# Patient Record
Sex: Male | Born: 1955 | Race: Black or African American | Hispanic: No | Marital: Married | Smoking: Never smoker
Health system: Southern US, Community
[De-identification: ages and names within clinical notes are randomized; demographics above are authoritative.]

## PROBLEM LIST (undated history)

## (undated) DIAGNOSIS — I10 Essential (primary) hypertension: Secondary | ICD-10-CM

## (undated) DIAGNOSIS — I509 Heart failure, unspecified: Secondary | ICD-10-CM

## (undated) DIAGNOSIS — E119 Type 2 diabetes mellitus without complications: Secondary | ICD-10-CM

## (undated) DIAGNOSIS — N289 Disorder of kidney and ureter, unspecified: Secondary | ICD-10-CM

---

## 2013-06-15 ENCOUNTER — Emergency Department (HOSPITAL_BASED_OUTPATIENT_CLINIC_OR_DEPARTMENT_OTHER)
Admission: EM | Admit: 2013-06-15 | Discharge: 2013-06-15 | Disposition: A | Discharge status: Other Acute Inpatient Hospital | Payer: 59 | Attending: Emergency Medicine | Admitting: Emergency Medicine

## 2013-06-15 ENCOUNTER — Encounter (HOSPITAL_BASED_OUTPATIENT_CLINIC_OR_DEPARTMENT_OTHER): Payer: Self-pay | Admitting: Emergency Medicine

## 2013-06-15 ENCOUNTER — Emergency Department (HOSPITAL_BASED_OUTPATIENT_CLINIC_OR_DEPARTMENT_OTHER): Payer: 59

## 2013-06-15 DIAGNOSIS — Z7982 Long term (current) use of aspirin: Secondary | ICD-10-CM | POA: Insufficient documentation

## 2013-06-15 DIAGNOSIS — R61 Generalized hyperhidrosis: Secondary | ICD-10-CM | POA: Insufficient documentation

## 2013-06-15 DIAGNOSIS — R799 Abnormal finding of blood chemistry, unspecified: Secondary | ICD-10-CM | POA: Insufficient documentation

## 2013-06-15 DIAGNOSIS — R7989 Other specified abnormal findings of blood chemistry: Secondary | ICD-10-CM

## 2013-06-15 DIAGNOSIS — R748 Abnormal levels of other serum enzymes: Secondary | ICD-10-CM | POA: Insufficient documentation

## 2013-06-15 DIAGNOSIS — R05 Cough: Secondary | ICD-10-CM | POA: Insufficient documentation

## 2013-06-15 DIAGNOSIS — R059 Cough, unspecified: Secondary | ICD-10-CM | POA: Insufficient documentation

## 2013-06-15 DIAGNOSIS — Z79899 Other long term (current) drug therapy: Secondary | ICD-10-CM | POA: Insufficient documentation

## 2013-06-15 DIAGNOSIS — I161 Hypertensive emergency: Secondary | ICD-10-CM

## 2013-06-15 DIAGNOSIS — N179 Acute kidney failure, unspecified: Secondary | ICD-10-CM | POA: Insufficient documentation

## 2013-06-15 DIAGNOSIS — R0602 Shortness of breath: Secondary | ICD-10-CM | POA: Insufficient documentation

## 2013-06-15 DIAGNOSIS — I10 Essential (primary) hypertension: Secondary | ICD-10-CM | POA: Insufficient documentation

## 2013-06-15 HISTORY — DX: Essential (primary) hypertension: I10

## 2013-06-15 LAB — CBC WITH DIFFERENTIAL/PLATELET
Basophils Relative: 1 % (ref 0–1)
Eosinophils Absolute: 0.1 10*3/uL (ref 0.0–0.7)
HCT: 42.3 % (ref 39.0–52.0)
Hemoglobin: 13.9 g/dL (ref 13.0–17.0)
Lymphs Abs: 1.3 10*3/uL (ref 0.7–4.0)
MCH: 28.9 pg (ref 26.0–34.0)
MCHC: 32.9 g/dL (ref 30.0–36.0)
MCV: 87.9 fL (ref 78.0–100.0)
Monocytes Absolute: 0.7 10*3/uL (ref 0.1–1.0)
Monocytes Relative: 9 % (ref 3–12)
Neutro Abs: 5.8 10*3/uL (ref 1.7–7.7)
RBC: 4.81 MIL/uL (ref 4.22–5.81)
RDW: 15.4 % (ref 11.5–15.5)

## 2013-06-15 LAB — BASIC METABOLIC PANEL
BUN: 23 mg/dL (ref 6–23)
Chloride: 106 mEq/L (ref 96–112)
Creatinine, Ser: 2.5 mg/dL — ABNORMAL HIGH (ref 0.50–1.35)
GFR calc Af Amer: 31 mL/min — ABNORMAL LOW (ref >90)
Glucose, Bld: 231 mg/dL — ABNORMAL HIGH (ref 70–99)

## 2013-06-15 MED ORDER — LABETALOL HCL 5 MG/ML IV SOLN
20.0000 mg | Freq: Once | INTRAVENOUS | Status: AC
  Administered 2013-06-15: 20 mg via INTRAVENOUS

## 2013-06-15 MED ORDER — ASPIRIN 325 MG PO TABS
325.0000 mg | ORAL_TABLET | Freq: Once | ORAL | Status: AC
  Administered 2013-06-15: 325 mg via ORAL
  Filled 2013-06-15: qty 1

## 2013-06-15 MED ORDER — LABETALOL HCL 5 MG/ML IV SOLN
20.0000 mg | Freq: Once | INTRAVENOUS | Status: AC
  Administered 2013-06-15: 20 mg via INTRAVENOUS
  Filled 2013-06-15: qty 4

## 2013-06-15 MED ORDER — LABETALOL HCL 5 MG/ML IV SOLN
INTRAVENOUS | Status: AC
  Filled 2013-06-15: qty 4

## 2013-06-15 MED ORDER — HYDRALAZINE HCL 20 MG/ML IJ SOLN
10.0000 mg | Freq: Once | INTRAMUSCULAR | Status: AC
  Administered 2013-06-15: 10 mg via INTRAVENOUS
  Filled 2013-06-15: qty 1

## 2013-06-15 NOTE — ED Notes (Signed)
Critical lab called for troponin of 0.3, MD made aware.

## 2013-06-15 NOTE — ED Notes (Addendum)
Nosebleed this afternoon. No active bleeding at triage. BP noted. Pt states he does not take his BP medication.

## 2013-06-15 NOTE — ED Provider Notes (Signed)
CSN: [id number]     Arrival date & time 06/15/13  1805 History  This chart was scribed for Neta Ehlers, MD by Rolanda Lundborg, ED Scribe. This patient was seen in room MH09/MH09 and the patient's care was started at 6:53 PM.   Chief Complaint  Patient presents with  . Epistaxis   Patient is a 57 y.o. male presenting with nosebleeds. The history is provided by the patient. No language interpreter was used.  Epistaxis Progression:  Resolved Context: hypertension   Associated symptoms: cough   Associated symptoms: no congestion, no dizziness, no fever, no headaches and no sore throat   HPI Comments: Nickoli Puett is a 57 y.o. male with a h/o HTN who presents to the Emergency Department complaining of epistaxis of both nares that started this afternoon and lasted one hour, resolved in the ED, and started again ten minutes ago. He states it was coming out of his nose and going down his throat earlier today and is now going down his throat only. He has never had epistaxis before. Pt notes he has been off his blood pressure medication for two weeks. He states at baseline his BP fluctuates. He reports having mild SOB for the past week including at rest. He also reports diaphoresis and cough. 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 He denies headache, blurry vision, CP, nausea, emesis, changes in urine output.   Past Medical History  Diagnosis Date  . Hypertension    History reviewed. No pertinent past surgical history. No family history on file. History  Substance Use Topics  . Smoking status: Never Smoker   . Smokeless tobacco: Not on file  . Alcohol Use: Yes    Review of Systems  Constitutional: Positive for diaphoresis. Negative for fever, chills, activity change, appetite change and fatigue.  HENT: Positive for nosebleeds. Negative for congestion, drooling, facial swelling, rhinorrhea, sore throat, trouble swallowing and voice change.   Eyes: Negative for photophobia and  pain.  Respiratory: Positive for cough and shortness of breath. Negative for chest tightness and stridor.   Cardiovascular: Negative for chest pain and leg swelling.  Gastrointestinal: Negative for nausea, vomiting, abdominal pain, diarrhea, constipation and abdominal distention.  Endocrine: Negative for polydipsia and polyuria.  Genitourinary: Negative for dysuria, urgency, frequency, decreased urine volume and difficulty urinating.  Musculoskeletal: Negative for back pain, gait problem and neck pain.  Skin: Negative for color change, rash and wound.  Allergic/Immunologic: Negative for immunocompromised state.  Neurological: Negative for dizziness, facial asymmetry, speech difficulty, weakness, numbness and headaches.  Hematological: Does not bruise/bleed easily.  Psychiatric/Behavioral: Negative for confusion, decreased concentration and agitation.  All other systems reviewed and are negative.    Allergies  Review of patient's allergies indicates no known allergies.  Home Medications   Current Outpatient Rx  Name  Route  Sig  Dispense  Refill  . aspirin 81 MG tablet   Oral   Take 81 mg by mouth daily.         . benzonatate (TESSALON) 200 MG capsule   Oral   Take 200 mg by mouth 3 (three) times daily as needed for cough.         . cloNIDine (CATAPRES) 0.1 MG tablet   Oral   Take 0.1 mg by mouth 2 (two) times daily.         Marland Kitchen diltiazem (CARDIZEM CD) 240 MG 24 hr capsule   Oral   Take 240 mg by mouth daily.         Marland Kitchen  fish oil-omega-3 fatty acids 1000 MG capsule   Oral   Take 1 g by mouth daily.         . furosemide (LASIX) 20 MG tablet   Oral   Take 20 mg by mouth daily.         Marland Kitchen glimepiride (AMARYL) 4 MG tablet   Oral   Take 4 mg by mouth daily before breakfast.         . hydrALAZINE (APRESOLINE) 50 MG tablet   Oral   Take 50 mg by mouth 2 (two) times daily.         Marland Kitchen olmesartan-hydrochlorothiazide (BENICAR HCT) 40-25 MG per tablet   Oral    Take 1 tablet by mouth daily.         . pioglitazone-metformin (ACTOPLUS MET) 15-850 MG per tablet   Oral   Take 1 tablet by mouth daily.         . sitaGLIPtin (JANUVIA) 100 MG tablet   Oral   Take 100 mg by mouth daily.         . Vitamin D, Ergocalciferol, (DRISDOL) 50000 UNITS CAPS capsule   Oral   Take 50,000 Units by mouth every 7 (seven) days.          BP 259/157  Pulse 105  Temp(Src) 98.6 F (37 C) (Oral)  Resp 20  Wt 260 lb (117.935 kg)  SpO2 100% Physical Exam  Nursing note and vitals reviewed. Constitutional: He is oriented to person, place, and time. He appears well-developed and well-nourished. No distress.  HENT:  Head: Normocephalic and atraumatic.  Mouth/Throat: No oropharyngeal exudate.  Small amount of dark blood in posterior oropharynx w/o active bleeding.  Small amt dried blood L nare  Eyes: Pupils are equal, round, and reactive to light.  Neck: Normal range of motion. Neck supple.  Cardiovascular: Normal rate, regular rhythm and normal heart sounds.  Exam reveals no gallop and no friction rub.   No murmur heard. Pitting edema bilaterally.  Pulmonary/Chest: Effort normal and breath sounds normal. No respiratory distress. He has no wheezes. He has no rales.  Lungs are clear.  Abdominal: Soft. Bowel sounds are normal. He exhibits no distension and no mass. There is no tenderness. There is no rebound and no guarding.  Musculoskeletal: Normal range of motion. He exhibits no edema and no tenderness.  Neurological: He is alert and oriented to person, place, and time. He has normal strength. He displays no tremor. No cranial nerve deficit or sensory deficit. He exhibits normal muscle tone. He displays a negative Romberg sign. Coordination and gait normal. GCS eye subscore is 4. GCS verbal subscore is 5. GCS motor subscore is 6.  Cranial nerves are intact. Normal sensation. Normal coordination. Normal strength.  Skin: Skin is warm and dry.  Psychiatric: He  has a normal mood and affect.    ED Course  Procedures (including critical care time) Medications  hydrALAZINE (APRESOLINE) injection 10 mg (10 mg Intravenous Given 06/15/13 1938)  aspirin tablet 325 mg (325 mg Oral Given 06/15/13 2151)  labetalol (NORMODYNE,TRANDATE) injection 20 mg (20 mg Intravenous Given 06/15/13 2150)  labetalol (NORMODYNE,TRANDATE) 5 MG/ML injection (  Duplicate [id number])  labetalol (NORMODYNE,TRANDATE) injection 20 mg (20 mg Intravenous Given 06/15/13 2330)    DIAGNOSTIC STUDIES: Oxygen Saturation is 100% on RA, normal by my interpretation.    COORDINATION OF CARE: 7:26 PM- Discussed treatment plan with pt which includes CXR, CBC, BNP, BMP, troponin, hydralazine injection, and monitoring blood pressure. Pt  agrees to plan.    Labs Review Labs Reviewed  BASIC METABOLIC PANEL - Abnormal; Notable for the following:    Glucose, Bld 231 (*)    Creatinine, Ser 2.50 (*)    GFR calc non Af Amer 27 (*)    GFR calc Af Amer 31 (*)    All other components within normal limits  PRO B NATRIURETIC PEPTIDE - Abnormal; Notable for the following:    Pro B Natriuretic peptide (BNP) 11703.0 (*)    All other components within normal limits  TROPONIN I - Abnormal; Notable for the following:    Troponin I 0.30 (*)    All other components within normal limits  CBC WITH DIFFERENTIAL   Imaging Review Dg Chest 2 View  06/15/2013   CLINICAL DATA:  Epistaxis. Shortness of breath.  EXAM: CHEST  2 VIEW  COMPARISON:  None.  FINDINGS: Borderline enlarged cardiac silhouette. The lungs are mildly hyperexpanded with diffuse peribronchial thickening and mildly prominent interstitial markings. 1.2 cm nodular density overlying the right mid lung zone on the frontal view. Oval density overlying the left lower lung zone, laterally. This has an appearance suggesting callus formation associated with an old, healed 6 rib fracture. There is also a 2.1 cm nodular density overlying the heart on the  lateral view. Minimal bilateral pleural fluid. Enlarged right hilum and oval enlargement of the superior left hilum.  IMPRESSION: 1. Probable lung nodule or nodules, as described above. 2. Probable bilateral hilar adenopathy. 3. Minimal bilateral pleural fluid. 4. Changes of COPD and chronic bronchitis.  Recommendation: Chest CT with contrast to evaluate the probable lung nodules and hilar adenopathy.   Electronically Signed   By: Enrique Sack M.D.   On: 06/15/2013 20:16    EKG Interpretation   None      Date: 06/16/2013  Rate: 102  Rhythm: sinus tachycardia  QRS Axis: normal  Intervals: normal  ST/T Wave abnormalities: TW inversions inferior and lateral leads  Conduction Disutrbances:LA enlargement  Narrative Interpretation:   Old EKG Reviewed: none available    MDM   1. Hypertensive emergency   2. ARF (acute renal failure)   3. Elevated brain natriuretic peptide (BNP) level   4. Elevated troponin    Pt is a 57 y.o. male with Pmhx as above who presents with epistaxis that has resolved, found to have elevated BP of 259/157. Also reports SOB for about 1 week but denies h/a, neuro symptoms, CP, dec urination.  Epistaxis resolved, though able to visualize small amt blood in posterior oropharynx.  W/U here shows EKG w/ TWI, elevated BNP, Trop, Cr concerning for end organ ischemia.  Pt given dose of hydralazine, labetalol x2 with improvement of BP.  Pt and family agree to admission to [facility] with cardiology (Dr. Maida Sale), for hypertensive emergency.      I personally performed the services described in this documentation, which was scribed in my presence. The recorded information has been reviewed and is accurate.     Neta Ehlers, MD 06/16/13 [phone]

## 2015-01-13 ENCOUNTER — Emergency Department (HOSPITAL_BASED_OUTPATIENT_CLINIC_OR_DEPARTMENT_OTHER)
Admission: EM | Admit: 2015-01-13 | Discharge: 2015-01-13 | Disposition: A | Discharge status: Home / Self Care | Payer: 59 | Attending: Emergency Medicine | Admitting: Emergency Medicine

## 2015-01-13 ENCOUNTER — Encounter (HOSPITAL_BASED_OUTPATIENT_CLINIC_OR_DEPARTMENT_OTHER): Payer: Self-pay | Admitting: *Deleted

## 2015-01-13 ENCOUNTER — Emergency Department (HOSPITAL_BASED_OUTPATIENT_CLINIC_OR_DEPARTMENT_OTHER): Payer: 59

## 2015-01-13 DIAGNOSIS — M25551 Pain in right hip: Secondary | ICD-10-CM | POA: Diagnosis present

## 2015-01-13 DIAGNOSIS — Z7982 Long term (current) use of aspirin: Secondary | ICD-10-CM | POA: Insufficient documentation

## 2015-01-13 DIAGNOSIS — I10 Essential (primary) hypertension: Secondary | ICD-10-CM | POA: Diagnosis not present

## 2015-01-13 DIAGNOSIS — E119 Type 2 diabetes mellitus without complications: Secondary | ICD-10-CM | POA: Diagnosis not present

## 2015-01-13 DIAGNOSIS — Z79899 Other long term (current) drug therapy: Secondary | ICD-10-CM | POA: Diagnosis not present

## 2015-01-13 DIAGNOSIS — M1611 Unilateral primary osteoarthritis, right hip: Secondary | ICD-10-CM

## 2015-01-13 HISTORY — DX: Disorder of kidney and ureter, unspecified: N28.9

## 2015-01-13 HISTORY — DX: Heart failure, unspecified: I50.9

## 2015-01-13 HISTORY — DX: Type 2 diabetes mellitus without complications: E11.9

## 2015-01-13 MED ORDER — KETOROLAC TROMETHAMINE 60 MG/2ML IM SOLN
30.0000 mg | Freq: Once | INTRAMUSCULAR | Status: AC
  Administered 2015-01-13: 30 mg via INTRAMUSCULAR
  Filled 2015-01-13: qty 2

## 2015-01-13 MED ORDER — LABETALOL HCL 200 MG PO TABS
200.0000 mg | ORAL_TABLET | Freq: Once | ORAL | Status: DC
  Filled 2015-01-13: qty 1

## 2015-01-13 MED ORDER — HYDROCHLOROTHIAZIDE 25 MG PO TABS
25.0000 mg | ORAL_TABLET | Freq: Once | ORAL | Status: AC
  Administered 2015-01-13: 25 mg via ORAL
  Filled 2015-01-13: qty 1

## 2015-01-13 NOTE — Discharge Instructions (Signed)
Return to the emergency room with worsening of symptoms, new symptoms or with symptoms that are concerning, especially chest pain, shortness of breath, headache, visual changes, blood in urine or fevers, chills, nausea, vomiting, redness, swelling, unable to move right hip. RICE: Rest, Ice (three cycles of 20 mins on, 33mins off at least twice a day), compression/brace, elevation. Heating pad works well for back pain. Follow up with PCP/orthopedist if symptoms worsen or are persistent. Please call your doctor for a followup appointment within 24-48 hours. When you talk to your doctor please let them know that you were seen in the emergency department  and formulate a treatment plan to fully care for your new and ongoing problems.  Read below information and follow recommendations. Hip Exercises RANGE OF MOTION (ROM) AND STRETCHING EXERCISES  These exercises may help you when beginning to rehabilitate your injury. Doing them too aggressively can worsen your condition. Complete them slowly and gently. Your symptoms may resolve with or without further involvement from your physician, physical therapist or athletic trainer. While completing these exercises, remember:   Restoring tissue flexibility helps normal motion to return to the joints. This allows healthier, less painful movement and activity.  An effective stretch should be held for at least 30 seconds.  A stretch should never be painful. You should only feel a gentle lengthening or release in the stretched tissue. If these stretches worsen your symptoms even when done gently, consult your physician, physical therapist or athletic trainer. STRETCH - Hamstrings, Supine   Lie on your back. Loop a belt or towel over the ball of your right / left foot.  Straighten your right / left knee and slowly pull on the belt to raise your leg. Do not allow the right / left knee to bend. Keep your opposite leg flat on the floor.  Raise the leg until you feel  a gentle stretch behind your right / left knee or thigh. Hold this position for __________ seconds. Repeat __________ times. Complete this stretch __________ times per day.  STRETCH - Hip Rotators   Lie on your back on a firm surface. Grasp your right / left knee with your right / left hand and your ankle with your opposite hand.  Keeping your hips and shoulders firmly planted, gently pull your right / left knee and rotate your lower leg toward your opposite shoulder until you feel a stretch in your buttocks.  Hold this stretch for __________ seconds. Repeat this stretch __________ times. Complete this stretch __________ times per day. STRETCH - Hamstrings/Adductors, V-Sit   Sit on the floor with your legs extended in a large "V," keeping your knees straight.  With your head and chest upright, bend at your waist reaching for your right foot to stretch your left adductors.  You should feel a stretch in your left inner thigh. Hold for __________ seconds.  Return to the upright position to relax your leg muscles.  Continuing to keep your chest upright, bend straight forward at your waist to stretch your hamstrings.  You should feel a stretch behind both of your thighs and/or knees. Hold for __________ seconds.  Return to the upright position to relax your leg muscles.  Repeat steps 2 through 4 for opposite leg. Repeat __________ times. Complete this exercise __________ times per day.  STRETCHING - Hip Flexors, Lunge  Half kneel with your right / left knee on the floor and your opposite knee bent and directly over your ankle.  Keep good posture with your  head over your shoulders. Tighten your buttocks to point your tailbone downward; this will prevent your back from arching too much.  You should feel a gentle stretch in the front of your thigh and/or hip. If you do not feel any resistance, slightly slide your opposite foot forward and then slowly lunge forward so your knee once again  lines up over your ankle. Be sure your tailbone remains pointed downward.  Hold this stretch for __________ seconds. Repeat __________ times. Complete this stretch __________ times per day. STRENGTHENING EXERCISES These exercises may help you when beginning to rehabilitate your injury. They may resolve your symptoms with or without further involvement from your physician, physical therapist or athletic trainer. While completing these exercises, remember:   Muscles can gain both the endurance and the strength needed for everyday activities through controlled exercises.  Complete these exercises as instructed by your physician, physical therapist or athletic trainer. Progress the resistance and repetitions only as guided.  You may experience muscle soreness or fatigue, but the pain or discomfort you are trying to eliminate should never worsen during these exercises. If this pain does worsen, stop and make certain you are following the directions exactly. If the pain is still present after adjustments, discontinue the exercise until you can discuss the trouble with your clinician. STRENGTH - Hip Extensors, Bridge   Lie on your back on a firm surface. Bend your knees and place your feet flat on the floor.  Tighten your buttocks muscles and lift your bottom off the floor until your trunk is level with your thighs. You should feel the muscles in your buttocks and back of your thighs working. If you do not feel these muscles, slide your feet 1-2 inches further away from your buttocks.  Hold this position for __________ seconds.  Slowly lower your hips to the starting position and allow your buttock muscles relax completely before beginning the next repetition.  If this exercise is too easy, you may cross your arms over your chest. Repeat __________ times. Complete this exercise __________ times per day.  STRENGTH - Hip Abductors, Straight Leg Raises  Be aware of your form throughout the entire  exercise so that you exercise the correct muscles. Sloppy form means that you are not strengthening the correct muscles.  Lie on your side so that your head, shoulders, knee and hip line up. You may bend your lower knee to help maintain your balance. Your right / left leg should be on top.  Roll your hips slightly forward, so that your hips are stacked directly over each other and your right / left knee is facing forward.  Lift your top leg up 4-6 inches, leading with your heel. Be sure that your foot does not drift forward or that your knee does not roll toward the ceiling.  Hold this position for __________ seconds. You should feel the muscles in your outer hip lifting (you may not notice this until your leg begins to tire).  Slowly lower your leg to the starting position. Allow the muscles to fully relax before beginning the next repetition. Repeat __________ times. Complete this exercise __________ times per day.  STRENGTH - Hip Adductors, Straight Leg Raises   Lie on your side so that your head, shoulders, knee and hip line up. You may place your upper foot in front to help maintain your balance. Your right / left leg should be on the bottom.  Roll your hips slightly forward, so that your hips are stacked directly  over each other and your right / left knee is facing forward.  Tense the muscles in your inner thigh and lift your bottom leg 4-6 inches. Hold this position for __________ seconds.  Slowly lower your leg to the starting position. Allow the muscles to fully relax before beginning the next repetition. Repeat __________ times. Complete this exercise __________ times per day.  STRENGTH - Quadriceps, Straight Leg Raises  Quality counts! Watch for signs that the quadriceps muscle is working to insure you are strengthening the correct muscles and not "cheating" by substituting with healthier muscles.  Lay on your back with your right / left leg extended and your opposite knee  bent.  Tense the muscles in the front of your right / left thigh. You should see either your knee cap slide up or increased dimpling just above the knee. Your thigh may even quiver.  Tighten these muscles even more and raise your leg 4 to 6 inches off the floor. Hold for right / left seconds.  Keeping these muscles tense, lower your leg.  Relax the muscles slowly and completely in between each repetition. Repeat __________ times. Complete this exercise __________ times per day.  STRENGTH - Hip Abductors, Standing  Tie one end of a rubber exercise band/tubing to a secure surface (table, pole) and tie a loop at the other end.  Place the loop around your right / left ankle. Keeping your ankle with the band directly opposite of the secured end, step away until there is tension in the tube/band.  Hold onto a chair as needed for balance.  Keeping your back upright, your shoulders over your hips, and your toes pointing forward, lift your right / left leg out to your side. Be sure to lift your leg with your hip muscles. Do not "throw" your leg or tip your body to lift your leg.  Slowly and with control, return to the starting position. Repeat exercise __________ times. Complete this exercise __________ times per day.  STRENGTH - Quadriceps, Squats  Stand in a door frame so that your feet and knees are in line with the frame.  Use your hands for balance, not support, on the frame.  Slowly lower your weight, bending at the hips and knees. Keep your lower legs upright so that they are parallel with the door frame. Squat only within the range that does not increase your knee pain. Never let your hips drop below your knees.  Slowly return upright, pushing with your legs, not pulling with your hands. Document Released: 09/11/2005 Document Revised: 11/16/2011 Document Reviewed: 12/06/2008 [facility] Patient Information 2015 [place], [place]. This information is not intended to replace advice given to  you by your health care provider. Make sure you discuss any questions you have with your health care provider. Hypertension Hypertension, commonly called high blood pressure, is when the force of blood pumping through your arteries is too strong. Your arteries are the blood vessels that carry blood from your heart throughout your body. A blood pressure reading consists of a higher number over a lower number, such as 110/72. The higher number (systolic) is the pressure inside your arteries when your heart pumps. The lower number (diastolic) is the pressure inside your arteries when your heart relaxes. Ideally you want your blood pressure below 120/80. Hypertension forces your heart to work harder to pump blood. Your arteries may become narrow or stiff. Having hypertension puts you at risk for heart disease, stroke, and other problems.  RISK FACTORS Some risk factors  for high blood pressure are controllable. Others are not.  Risk factors you cannot control include:   Race. You may be at higher risk if you are African American.  Age. Risk increases with age.  Gender. Men are at higher risk than women before age 44 years. After age 43, women are at higher risk than men. Risk factors you can control include:  Not getting enough exercise or physical activity.  Being overweight.  Getting too much fat, sugar, calories, or salt in your diet.  Drinking too much alcohol. SIGNS AND SYMPTOMS Hypertension does not usually cause signs or symptoms. Extremely high blood pressure (hypertensive crisis) may cause headache, anxiety, shortness of breath, and nosebleed. DIAGNOSIS  To check if you have hypertension, your health care provider will measure your blood pressure while you are seated, with your arm held at the level of your heart. It should be measured at least twice using the same arm. Certain conditions can cause a difference in blood pressure between your right and left arms. A blood pressure reading  that is higher than normal on one occasion does not mean that you need treatment. If one blood pressure reading is high, ask your health care provider about having it checked again. TREATMENT  Treating high blood pressure includes making lifestyle changes and possibly taking medicine. Living a healthy lifestyle can help lower high blood pressure. You may need to change some of your habits. Lifestyle changes may include:  Following the DASH diet. This diet is high in fruits, vegetables, and whole grains. It is low in salt, red meat, and added sugars.  Getting at least 2 hours of brisk physical activity every week.  Losing weight if necessary.  Not smoking.  Limiting alcoholic beverages.  Learning ways to reduce stress. If lifestyle changes are not enough to get your blood pressure under control, your health care provider may prescribe medicine. You may need to take more than one. Work closely with your health care provider to understand the risks and benefits. HOME CARE INSTRUCTIONS  Have your blood pressure rechecked as directed by your health care provider.   Take medicines only as directed by your health care provider. Follow the directions carefully. Blood pressure medicines must be taken as prescribed. The medicine does not work as well when you skip doses. Skipping doses also puts you at risk for problems.   Do not smoke.   Monitor your blood pressure at home as directed by your health care provider. SEEK MEDICAL CARE IF:   You think you are having a reaction to medicines taken.  You have recurrent headaches or feel dizzy.  You have swelling in your ankles.  You have trouble with your vision. SEEK IMMEDIATE MEDICAL CARE IF:  You develop a severe headache or confusion.  You have unusual weakness, numbness, or feel faint.  You have severe chest or abdominal pain.  You vomit repeatedly.  You have trouble breathing. MAKE SURE YOU:   Understand these  instructions.  Will watch your condition.  Will get help right away if you are not doing well or get worse. Document Released: 08/24/2005 Document Revised: 01/08/2014 Document Reviewed: 06/16/2013 [facility] Patient Information 2015 [place], [place]. This information is not intended to replace advice given to you by your health care provider. Make sure you discuss any questions you have with your health care provider.

## 2015-01-13 NOTE — ED Provider Notes (Signed)
CSN: [id number]     Arrival date & time 01/13/15  1428 History   First MD Initiated Contact with Patient 01/13/15 1449     Chief Complaint  Patient presents with  . Hip Pain     (Consider location/radiation/quality/duration/timing/severity/associated sxs/prior Treatment) HPI  Kostas Shadd is a 59 y.o. male with PMH of attention, diabetes presenting with 1 week of intermittent aching right hip pain worse with ambulation. Patient does not begin anything for his symptoms. Patient denies any fevers, chills, nausea, vomiting, numbness, tingling, redness or swelling. No injury or fall. No chest pain, shortness of breath, headache, visual changes, hematuria. No abdominal pain.   Past Medical History  Diagnosis Date  . Hypertension   . Diabetes mellitus without complication    History reviewed. No pertinent past surgical history. No family history on file. History  Substance Use Topics  . Smoking status: Never Smoker   . Smokeless tobacco: Never Used  . Alcohol Use: Yes     Comment: occasional    Review of Systems See above   Allergies  Review of patient's allergies indicates no known allergies.  Home Medications   Prior to Admission medications   Medication Sig Start Date End Date Taking? Authorizing Provider  aspirin 81 MG tablet Take 81 mg by mouth daily.   Yes Historical Provider, MD  atorvastatin (LIPITOR) 20 MG tablet Take 20 mg by mouth daily.   Yes Historical Provider, MD  doxazosin (CARDURA) 4 MG tablet Take 4 mg by mouth daily.   Yes Historical Provider, MD  fish oil-omega-3 fatty acids 1000 MG capsule Take 1 g by mouth daily.   Yes Historical Provider, MD  glimepiride (AMARYL) 4 MG tablet Take 4 mg by mouth daily before breakfast.   Yes Historical Provider, MD  hydrALAZINE (APRESOLINE) 50 MG tablet Take 100 mg by mouth 3 (three) times daily.    Yes Historical Provider, MD  isosorbide dinitrate (ISORDIL) 20 MG tablet Take 20 mg by mouth daily.   Yes Historical Provider,  MD  labetalol (NORMODYNE) 200 MG tablet Take 200 mg by mouth daily.   Yes Historical Provider, MD  losartan-hydrochlorothiazide (HYZAAR) 100-25 MG per tablet Take 1 tablet by mouth daily.   Yes Historical Provider, MD  sitaGLIPtin (JANUVIA) 100 MG tablet Take 100 mg by mouth daily.   Yes Historical Provider, MD  tamsulosin (FLOMAX) 0.4 MG CAPS capsule Take 0.4 mg by mouth.   Yes Historical Provider, MD  torsemide (DEMADEX) 20 MG tablet Take 20 mg by mouth daily.   Yes Historical Provider, MD  Vitamin D, Ergocalciferol, (DRISDOL) 50000 UNITS CAPS capsule Take 50,000 Units by mouth every 7 (seven) days.   Yes Historical Provider, MD  benzonatate (TESSALON) 200 MG capsule Take 200 mg by mouth 3 (three) times daily as needed for cough.    Historical Provider, MD  cloNIDine (CATAPRES) 0.1 MG tablet Take 0.1 mg by mouth 2 (two) times daily.    Historical Provider, MD  diltiazem (CARDIZEM CD) 240 MG 24 hr capsule Take 240 mg by mouth daily.    Historical Provider, MD  furosemide (LASIX) 20 MG tablet Take 20 mg by mouth daily.    Historical Provider, MD  olmesartan-hydrochlorothiazide (BENICAR HCT) 40-25 MG per tablet Take 1 tablet by mouth daily.    Historical Provider, MD  pioglitazone-metformin (ACTOPLUS MET) 15-850 MG per tablet Take 1 tablet by mouth daily.    Historical Provider, MD   BP 193/98 mmHg  Pulse 61  Temp(Src) 98.3 F (36.8  C) (Oral)  Resp 16  Ht 6' (1.829 m)  Wt 268 lb (121.564 kg)  BMI 36.34 kg/m2  SpO2 97% Physical Exam  Constitutional: He appears well-developed and well-nourished. No distress.  HENT:  Head: Normocephalic and atraumatic.  Eyes: Conjunctivae are normal. Right eye exhibits no discharge. Left eye exhibits no discharge.  Cardiovascular: Normal rate and regular rhythm.   2+ DP and PT pulses equal bilaterally.  Pulmonary/Chest: Effort normal and breath sounds normal. No respiratory distress.  Abdominal: Soft. There is no tenderness.  Musculoskeletal:  Right  lateral hip tenderness to palpation at IT band. FROM without pain. No skin changes strength and sensation intact. Pt ambulatory  Neurological: He is alert. Coordination normal.  Skin: He is not diaphoretic.  Psychiatric: He has a normal mood and affect. His behavior is normal.  Nursing note and vitals reviewed.   ED Course  Procedures (including critical care time) Labs Review Labs Reviewed - No data to display  Imaging Review Dg Hip Unilat With Pelvis 2-3 Views Right  01/13/2015   CLINICAL DATA:  Right hip pain for 1 week, no known injury, initial encounter  EXAM: RIGHT HIP (WITH PELVIS) 2-3 VIEWS  COMPARISON:  None.  FINDINGS: No acute fracture or dislocation is noted. Mild degenerative changes of the hip joint are seen. No soft tissue changes are noted.  IMPRESSION: No acute abnormality seen.   Electronically Signed   By: Inez Catalina M.D.   On: 01/13/2015 16:02     EKG Interpretation None      MDM   Final diagnoses:  Hip pain, right  Essential hypertension  Osteoarthritis of right hip, unspecified osteoarthritis type   Patient X-Ray negative for obvious fracture or dislocation. Mild degenerative changes. Pain managed in ED. Pt without erythema, edema or warmth to joint. Neurovascularly intact. I doubt septic arthritis. Pt advised to follow up with PCP/orthopedics if symptoms persist. Discussed RICE, and conservative therapy recommended and discussed.   Patient noted to be hypertensive in the emergency department.  No signs of hypertensive urgency. Pt with reproducible right hip pain and no back or abdominal pain. Exam and presentation not consistent with AAA. Discussed with patient the need for close follow-up and management by their primary care physician.   Discussed return precautions with patient. Discussed all results and patient verbalizes understanding and agrees with plan.    Al Corpus, PA-C 01/13/15 [place], MD 01/13/15 1859

## 2015-01-13 NOTE — ED Notes (Signed)
Right hip pain x 1 week- denies fall or other injury

## 2017-09-14 ENCOUNTER — Other Ambulatory Visit: Payer: Self-pay

## 2017-09-14 ENCOUNTER — Emergency Department (HOSPITAL_BASED_OUTPATIENT_CLINIC_OR_DEPARTMENT_OTHER)
Admission: EM | Admit: 2017-09-14 | Discharge: 2017-09-14 | DRG: 291 | Disposition: A | Discharge status: Home / Self Care | Payer: 59 | Attending: Internal Medicine | Admitting: Internal Medicine

## 2017-09-14 ENCOUNTER — Encounter (HOSPITAL_BASED_OUTPATIENT_CLINIC_OR_DEPARTMENT_OTHER): Payer: Self-pay | Admitting: Emergency Medicine

## 2017-09-14 ENCOUNTER — Emergency Department (HOSPITAL_BASED_OUTPATIENT_CLINIC_OR_DEPARTMENT_OTHER): Payer: 59

## 2017-09-14 DIAGNOSIS — N183 Chronic kidney disease, stage 3 (moderate): Secondary | ICD-10-CM | POA: Diagnosis not present

## 2017-09-14 DIAGNOSIS — Z7982 Long term (current) use of aspirin: Secondary | ICD-10-CM

## 2017-09-14 DIAGNOSIS — Z7984 Long term (current) use of oral hypoglycemic drugs: Secondary | ICD-10-CM | POA: Diagnosis not present

## 2017-09-14 DIAGNOSIS — N179 Acute kidney failure, unspecified: Secondary | ICD-10-CM | POA: Diagnosis present

## 2017-09-14 DIAGNOSIS — Z79899 Other long term (current) drug therapy: Secondary | ICD-10-CM

## 2017-09-14 DIAGNOSIS — R0602 Shortness of breath: Secondary | ICD-10-CM | POA: Diagnosis present

## 2017-09-14 DIAGNOSIS — E1122 Type 2 diabetes mellitus with diabetic chronic kidney disease: Secondary | ICD-10-CM | POA: Diagnosis present

## 2017-09-14 DIAGNOSIS — Z9119 Patient's noncompliance with other medical treatment and regimen: Secondary | ICD-10-CM

## 2017-09-14 DIAGNOSIS — N189 Chronic kidney disease, unspecified: Secondary | ICD-10-CM

## 2017-09-14 DIAGNOSIS — G4733 Obstructive sleep apnea (adult) (pediatric): Secondary | ICD-10-CM | POA: Diagnosis present

## 2017-09-14 DIAGNOSIS — I130 Hypertensive heart and chronic kidney disease with heart failure and stage 1 through stage 4 chronic kidney disease, or unspecified chronic kidney disease: Secondary | ICD-10-CM | POA: Diagnosis not present

## 2017-09-14 DIAGNOSIS — I5043 Acute on chronic combined systolic (congestive) and diastolic (congestive) heart failure: Secondary | ICD-10-CM | POA: Diagnosis not present

## 2017-09-14 DIAGNOSIS — I509 Heart failure, unspecified: Secondary | ICD-10-CM

## 2017-09-14 LAB — CBC WITH DIFFERENTIAL/PLATELET
BASOS ABS: 0 10*3/uL (ref 0.0–0.1)
Basophils Relative: 0 %
Eosinophils Absolute: 0.1 10*3/uL (ref 0.0–0.7)
Eosinophils Relative: 2 %
HEMATOCRIT: 34.3 % — AB (ref 39.0–52.0)
Hemoglobin: 11.2 g/dL — ABNORMAL LOW (ref 13.0–17.0)
LYMPHS PCT: 20 %
Lymphs Abs: 0.9 10*3/uL (ref 0.7–4.0)
MCH: 29.6 pg (ref 26.0–34.0)
MCHC: 32.7 g/dL (ref 30.0–36.0)
MCV: 90.7 fL (ref 78.0–100.0)
MONO ABS: 0.5 10*3/uL (ref 0.1–1.0)
MONOS PCT: 11 %
NEUTROS ABS: 3.1 10*3/uL (ref 1.7–7.7)
Neutrophils Relative %: 67 %
Platelets: 138 10*3/uL — ABNORMAL LOW (ref 150–400)
RBC: 3.78 MIL/uL — ABNORMAL LOW (ref 4.22–5.81)
RDW: 13.3 % (ref 11.5–15.5)
WBC: 4.7 10*3/uL (ref 4.0–10.5)

## 2017-09-14 LAB — COMPREHENSIVE METABOLIC PANEL
ALK PHOS: 60 U/L (ref 38–126)
ALT: 26 U/L (ref 17–63)
AST: 21 U/L (ref 15–41)
Albumin: 3.3 g/dL — ABNORMAL LOW (ref 3.5–5.0)
Anion gap: 7 (ref 5–15)
BUN: 35 mg/dL — AB (ref 6–20)
CALCIUM: 8.4 mg/dL — AB (ref 8.9–10.3)
CO2: 19 mmol/L — AB (ref 22–32)
CREATININE: 3.84 mg/dL — AB (ref 0.61–1.24)
Chloride: 112 mmol/L — ABNORMAL HIGH (ref 101–111)
GFR calc Af Amer: 18 mL/min — ABNORMAL LOW (ref >60)
GFR calc non Af Amer: 15 mL/min — ABNORMAL LOW (ref >60)
Glucose, Bld: 242 mg/dL — ABNORMAL HIGH (ref 65–99)
Potassium: 4.5 mmol/L (ref 3.5–5.1)
SODIUM: 138 mmol/L (ref 135–145)
Total Bilirubin: 0.6 mg/dL (ref 0.3–1.2)
Total Protein: 6.3 g/dL — ABNORMAL LOW (ref 6.5–8.1)

## 2017-09-14 LAB — TROPONIN I
Troponin I: 0.04 ng/mL (ref <0.03)
Troponin I: 0.04 ng/mL (ref <0.03)

## 2017-09-14 LAB — BRAIN NATRIURETIC PEPTIDE: B Natriuretic Peptide: 335.9 pg/mL — ABNORMAL HIGH (ref 0.0–100.0)

## 2017-09-14 LAB — MAGNESIUM: MAGNESIUM: 2.1 mg/dL (ref 1.7–2.4)

## 2017-09-14 LAB — CBG MONITORING, ED: Glucose-Capillary: 232 mg/dL — ABNORMAL HIGH (ref 65–99)

## 2017-09-14 MED ORDER — POTASSIUM CHLORIDE CRYS ER 20 MEQ PO TBCR
20.0000 meq | EXTENDED_RELEASE_TABLET | Freq: Once | ORAL | Status: AC
  Administered 2017-09-14: 20 meq via ORAL
  Filled 2017-09-14: qty 1

## 2017-09-14 MED ORDER — FUROSEMIDE 10 MG/ML IJ SOLN
20.0000 mg | Freq: Once | INTRAMUSCULAR | Status: AC
  Administered 2017-09-14: 20 mg via INTRAVENOUS
  Filled 2017-09-14: qty 2

## 2017-09-14 NOTE — ED Notes (Signed)
Pt requesting to go home because a bed is still not available and do not know when one will be available. MD aware.

## 2017-09-14 NOTE — ED Triage Notes (Signed)
Dyspnea on exertion for two weeks.  Pt also having generalized edema since being placed on new medication.  Stopped yesterday.  No cough.  No fever.

## 2017-09-14 NOTE — ED Provider Notes (Signed)
I assumed care of this patient from Margarita Mail at 1600.  Please see their note for further details of Hx, PE.  Briefly patient is a 62 y.o. male who presented with dyspnea on exertion concerning for CHF exacerbation.  Initially patient was currently admitted but given the wait for transfer admitting hospital, patient was given initial treatment here in the emergency department with IV Lasix resulting in significant urine output and improvement work of breathing with resolved dyspnea on exertion.  He was ambulated without complication.  We discussed additional options with the patient including continued admission versus discharge home with return in the morning for reevaluation of his renal function.  Patient shows be discharged home and will return tomorrow morning for reevaluation.  Fortunately Mrs. Kenton Kingfisher will be here tomorrow.       Fatima Blank, MD 09/16/17 [phone]

## 2017-09-14 NOTE — ED Notes (Signed)
Pt ambulated without any issues, spo2 98% and heart rate 80.

## 2017-09-14 NOTE — ED Provider Notes (Signed)
[facility] EMERGENCY DEPARTMENT Provider Note   CSN: [id number] Arrival date & time: 09/14/17  0848     History   Chief Complaint Chief Complaint  Patient presents with  . Shortness of Breath    HPI Anthony [name] is a 62 y.o. male presents the emergency department with chief complaint of shortness of breath.  He has a past medical history of CHF, diabetes, obstructive sleep apnea, hypertension and chronic kidney disease.  The patient is followed by nephrology.  2 weeks ago he was placed on 2 new blood pressure agents with 1 of which was minoxidil.  His nephrologist warn him he would have some swelling however he had a 20 pound weight gain and systemic swelling accompanied by exertional dyspnea over the past 2 weeks.  Patient denies orthopnea or PND but does have obstructive sleep apnea and has been noncompliant for very long time with his CPAP usage.  His family member who is by the bedside states she has been worried because he seems to stop sleeping a lot however she describes it as snoring which is interrupted by breathlessness.  It seems to be more in line with his obstructive sleep apnea.  Patient states that prior to 2 weeks ago he could walk from his car into work or up a flight of stairs and now he is winded and has to stop and catch his breath with minimal exertion.  He is unable to climb a complete flight of stairs.  He saw his nephrologist yesterday who stopped his minoxidil.  The patient acknowledges significant peripheral edema, he denies wheezing or recent URI symptoms.  He has no fevers or chills.  He denies any recent episodes of chest pain or current chest pain.  HPI  Past Medical History:  Diagnosis Date  . CHF (congestive heart failure) ([place])   . Diabetes mellitus without complication ([place])   . Hypertension   . Renal disorder     Patient Active Problem List   Diagnosis Date Noted  . Acute on chronic heart failure ([place]) 09/14/2017    History reviewed. No  pertinent surgical history.     Home Medications    Prior to Admission medications   Medication Sig Start Date End Date Taking? Authorizing Provider  aspirin 81 MG tablet Take 81 mg by mouth daily.    [provider]  atorvastatin (LIPITOR) 20 MG tablet Take 20 mg by mouth daily.    [provider]  benzonatate (TESSALON) 200 MG capsule Take 200 mg by mouth 3 (three) times daily as needed for cough.    [provider]  cloNIDine (CATAPRES) 0.1 MG tablet Take 0.1 mg by mouth 2 (two) times daily.    [provider]  diltiazem (CARDIZEM CD) 240 MG 24 hr capsule Take 240 mg by mouth daily.    [provider]  doxazosin (CARDURA) 4 MG tablet Take 4 mg by mouth daily.    [provider]  fish oil-omega-3 fatty acids 1000 MG capsule Take 1 g by mouth daily.    [provider]  furosemide (LASIX) 20 MG tablet Take 20 mg by mouth daily.    [provider]  glimepiride (AMARYL) 4 MG tablet Take 4 mg by mouth daily before breakfast.    [provider]  hydrALAZINE (APRESOLINE) 50 MG tablet Take 100 mg by mouth 3 (three) times daily.     [provider]  isosorbide dinitrate (ISORDIL) 20 MG tablet Take 20 mg by mouth daily.  [provider]  labetalol (NORMODYNE) 200 MG tablet Take 200 mg by mouth daily.    [provider]  losartan-hydrochlorothiazide (HYZAAR) 100-25 MG per tablet Take 1 tablet by mouth daily.    [provider]  olmesartan-hydrochlorothiazide (BENICAR HCT) 40-25 MG per tablet Take 1 tablet by mouth daily.    [provider]  pioglitazone-metformin (ACTOPLUS MET) 15-850 MG per tablet Take 1 tablet by mouth daily.    [provider]  sitaGLIPtin (JANUVIA) 100 MG tablet Take 100 mg by mouth daily.    [provider]  tamsulosin (FLOMAX) 0.4 MG CAPS capsule Take 0.4 mg by mouth.    [provider]  torsemide (DEMADEX) 20 MG tablet Take  20 mg by mouth daily.    [provider]  Vitamin D, Ergocalciferol, (DRISDOL) 50000 UNITS CAPS capsule Take 50,000 Units by mouth every 7 (seven) days.    [provider]    Family History No family history on file.  Social History Social History   Tobacco Use  . Smoking status: Never Smoker  . Smokeless tobacco: Never Used  Substance Use Topics  . Alcohol use: Yes    Comment: occasional  . Drug use: No     Allergies   Patient has no known allergies.   Review of Systems Review of Systems  Ten systems reviewed and are negative for acute change, except as noted in the HPI.   Physical Exam Updated Vital Signs BP 138/81   Pulse 80   Temp 98.2 F (36.8 C) (Oral)   Resp 18   Ht 6' (1.829 m)   Wt 126.6 kg (279 lb)   SpO2 97%   BMI 37.84 kg/m   Physical Exam  Constitutional: He appears well-developed and well-nourished. No distress.  HENT:  Head: Normocephalic and atraumatic.  Mouth/Throat: Oropharynx is clear and moist.  Voice is hoarse  Eyes: Conjunctivae and EOM are normal. Pupils are equal, round, and reactive to light. No scleral icterus.  Neck: Normal range of motion. Neck supple. No JVD present.  Cardiovascular: Normal rate, regular rhythm and normal heart sounds.  Pulmonary/Chest: Effort normal. No respiratory distress. He has decreased breath sounds in the right lower field and the left lower field. He has no wheezes. He has no rhonchi. He has no rales.  Abdominal: Soft. Bowel sounds are normal. He exhibits distension. There is no tenderness.  Musculoskeletal:       Right lower leg: He exhibits edema (3+ pitting edema in the lower extremities).       Left lower leg: He exhibits edema.  Neurological: He is alert.  Skin: Skin is warm and dry. He is not diaphoretic.  Psychiatric: His behavior is normal.  Nursing note and vitals reviewed.    ED Treatments / Results  Labs (all labs ordered are listed, but only abnormal results are  displayed) Labs Reviewed  CBC WITH DIFFERENTIAL/PLATELET - Abnormal; Notable for the following components:      Result Value   RBC 3.78 (*)    Hemoglobin 11.2 (*)    HCT 34.3 (*)    Platelets 138 (*)    All other components within normal limits  BRAIN NATRIURETIC PEPTIDE - Abnormal; Notable for the following components:   B Natriuretic Peptide 335.9 (*)    All other components within normal limits  TROPONIN I - Abnormal; Notable for the following components:   Troponin I 0.04 (*)    All other components within normal limits  COMPREHENSIVE METABOLIC PANEL -  Abnormal; Notable for the following components:   Chloride 112 (*)    CO2 19 (*)    Glucose, Bld 242 (*)    BUN 35 (*)    Creatinine, Ser 3.84 (*)    Calcium 8.4 (*)    Total Protein 6.3 (*)    Albumin 3.3 (*)    GFR calc non Af Amer 15 (*)    GFR calc Af Amer 18 (*)    All other components within normal limits  TROPONIN I - Abnormal; Notable for the following components:   Troponin I 0.04 (*)    All other components within normal limits  CBG MONITORING, ED - Abnormal; Notable for the following components:   Glucose-Capillary 232 (*)    All other components within normal limits  MAGNESIUM  COMPREHENSIVE METABOLIC PANEL    EKG  EKG Interpretation  Date/Time:  Tuesday September 14 2017 09:41:01 EST Ventricular Rate:  69 PR Interval:    QRS Duration: 90 QT Interval:  505 QTC Calculation: 542 R Axis:   36 Text Interpretation:  Sinus rhythm Low voltage, precordial leads Prolonged QT interval No STEMI. Similar to prior.  Confirmed by Nanda Quinton [phone]) on 09/14/2017 9:56:45 AM       Radiology Dg Chest 2 View  Result Date: 09/14/2017 CLINICAL DATA:  Shortness of breath. EXAM: CHEST  2 VIEW COMPARISON:  Chest x-ray dated June 15, 2013. FINDINGS: The cardiomediastinal silhouette is mildly enlarged. Mild central pulmonary vascular congestion and increased interstitial markings are similar to prior study. Unchanged 1.2 cm  nodule in the right midlung on the frontal view. Unchanged 2.1 cm nodule overlying the heart on the lateral view. No consolidation. Trace bilateral pleural effusions. No acute osseous abnormality. IMPRESSION: 1. Mild cardiomegaly and pulmonary interstitial edema with trace bilateral pleural effusions. 2. Relatively unchanged bilateral lung nodules as described above. Given their stability since 2014 without new nodule, these are likely benign. Electronically Signed   By: Titus Dubin M.D.   On: 09/14/2017 10:09    Procedures Procedures (including critical care time)  Medications Ordered in ED Medications  furosemide (LASIX) injection 20 mg (20 mg Intravenous Given 09/14/17 1134)  potassium chloride SA (K-DUR,KLOR-CON) CR tablet 20 mEq (20 mEq Oral Given 09/14/17 1134)     Initial Impression / Assessment and Plan / ED Course  I have reviewed the triage vital signs and the nursing notes.  Pertinent labs & imaging results that were available during my care of the patient were reviewed by me and considered in my medical decision making (see chart for details).  Clinical Course as of Sep 14 1699  Tue Sep 14, 2017  1024 I Spoke with the office staff at [facility] and confirmed the patient's kidney fx has not sig changed since 08/23/2017. Last Cr was 3.3, GFR was 20 , CO2 was 20.    [AH]  9937 Should not repeat troponin is the same.  He has diuresed more than 1 L of fluid and ambulated through the emergency department with out any significant dyspnea and feels greatly improved over his initial complaint symptoms of dyspnea.  The patient has been admitted we did explain to the patient that there is a very long wait for beds and he may be in the emergency department for a long time.  Will be reevaluated by Dr. Leonette Monarch with whom I have given sign out.  [AH]    Clinical Course User Index [AH] Margarita Mail, PA-C      Final Clinical  Impressions(s) / ED Diagnoses   Final diagnoses:  None     ED Discharge Orders    None       Margarita Mail, PA-C 09/14/17 [place], [facility], MD 09/15/17 [phone]

## 2017-09-14 NOTE — Progress Notes (Signed)
Called with patient from [facility].  He has a h/o CKD, HTN, DM, and CHF and was started on 2 new antihypertensives 2 weeks ago and now has 20 pound weight gain and significant SOB.  CXR with pulmonary edema. This appears to be a CHF exacerbation but there is concern due to his renal dysfunction.  Will admit to telemetry bed at [facility] when a bed is available.  Carlyon Shadow, M.D.

## 2017-09-15 ENCOUNTER — Encounter (HOSPITAL_BASED_OUTPATIENT_CLINIC_OR_DEPARTMENT_OTHER): Payer: Self-pay | Admitting: Emergency Medicine

## 2017-09-15 ENCOUNTER — Emergency Department (HOSPITAL_BASED_OUTPATIENT_CLINIC_OR_DEPARTMENT_OTHER)
Admission: EM | Admit: 2017-09-15 | Discharge: 2017-09-15 | Disposition: A | Discharge status: Home / Self Care | Payer: 59 | Attending: Emergency Medicine | Admitting: Emergency Medicine

## 2017-09-15 ENCOUNTER — Other Ambulatory Visit: Payer: Self-pay

## 2017-09-15 DIAGNOSIS — I130 Hypertensive heart and chronic kidney disease with heart failure and stage 1 through stage 4 chronic kidney disease, or unspecified chronic kidney disease: Secondary | ICD-10-CM | POA: Insufficient documentation

## 2017-09-15 DIAGNOSIS — Z7984 Long term (current) use of oral hypoglycemic drugs: Secondary | ICD-10-CM | POA: Diagnosis not present

## 2017-09-15 DIAGNOSIS — E1122 Type 2 diabetes mellitus with diabetic chronic kidney disease: Secondary | ICD-10-CM | POA: Insufficient documentation

## 2017-09-15 DIAGNOSIS — Z7982 Long term (current) use of aspirin: Secondary | ICD-10-CM | POA: Insufficient documentation

## 2017-09-15 DIAGNOSIS — N189 Chronic kidney disease, unspecified: Secondary | ICD-10-CM | POA: Diagnosis not present

## 2017-09-15 DIAGNOSIS — Z79899 Other long term (current) drug therapy: Secondary | ICD-10-CM | POA: Insufficient documentation

## 2017-09-15 DIAGNOSIS — I509 Heart failure, unspecified: Secondary | ICD-10-CM | POA: Insufficient documentation

## 2017-09-15 LAB — BASIC METABOLIC PANEL
ANION GAP: 7 (ref 5–15)
BUN: 39 mg/dL — ABNORMAL HIGH (ref 6–20)
CHLORIDE: 111 mmol/L (ref 101–111)
CO2: 21 mmol/L — ABNORMAL LOW (ref 22–32)
Calcium: 8.6 mg/dL — ABNORMAL LOW (ref 8.9–10.3)
Creatinine, Ser: 3.97 mg/dL — ABNORMAL HIGH (ref 0.61–1.24)
GFR calc Af Amer: 17 mL/min — ABNORMAL LOW (ref >60)
GFR, EST NON AFRICAN AMERICAN: 15 mL/min — AB (ref >60)
Glucose, Bld: 327 mg/dL — ABNORMAL HIGH (ref 65–99)
POTASSIUM: 4.9 mmol/L (ref 3.5–5.1)
SODIUM: 139 mmol/L (ref 135–145)

## 2017-09-15 NOTE — ED Triage Notes (Signed)
Patient returns to ER for recheck of BMP per provider.

## 2017-09-15 NOTE — Discharge Instructions (Signed)
Contact a health care provider if: Your symptoms get worse. You develop new symptoms. Get help right away if: You develop symptoms of ESRD, which include: Headaches. Numbness in the hands or feet. Easy bruising. Frequent hiccups. Chest pain. Shortness of breath. Lack of menstruation, in women. You have a fever. You have decreased urine production. You have pain or bleeding when you urinate.

## 2017-09-15 NOTE — ED Provider Notes (Signed)
[facility] EMERGENCY DEPARTMENT Provider Note   CSN: [id number] Arrival date & time: 09/15/17  0907     History   Chief Complaint Chief Complaint  Patient presents with  . Follow-up    HPI Anthony [name] is a 62 y.o. male he was seen by me yesterday in the emergency department with acute CHF exacerbation.  In the setting of chronic kidney disease we chose to admit the patient however due to extensive weight times the patient was diuresed here and his symptoms improved significantly.  We came to a shared decision that he may go home and return if he has any worsening in condition and also return this morning for repeat lab examination of his kidney function.  The patient is here for a repeat BMP.  He feels greatly improved and has no more exertional dyspnea at this time.  HPI  Past Medical History:  Diagnosis Date  . CHF (congestive heart failure) ([place])   . Diabetes mellitus without complication ([place])   . Hypertension   . Renal disorder     Patient Active Problem List   Diagnosis Date Noted  . Acute on chronic heart failure ([place]) 09/14/2017    History reviewed. No pertinent surgical history.     Home Medications    Prior to Admission medications   Medication Sig Start Date End Date Taking? Authorizing Provider  aspirin 81 MG tablet Take 81 mg by mouth daily.    [provider]  atorvastatin (LIPITOR) 20 MG tablet Take 20 mg by mouth daily.    [provider]  benzonatate (TESSALON) 200 MG capsule Take 200 mg by mouth 3 (three) times daily as needed for cough.    [provider]  cloNIDine (CATAPRES) 0.1 MG tablet Take 0.1 mg by mouth 2 (two) times daily.    [provider]  diltiazem (CARDIZEM CD) 240 MG 24 hr capsule Take 240 mg by mouth daily.    [provider]  doxazosin (CARDURA) 4 MG tablet Take 4 mg by mouth daily.    [provider]  fish oil-omega-3 fatty acids 1000 MG capsule Take 1 g by mouth  daily.    [provider]  furosemide (LASIX) 20 MG tablet Take 20 mg by mouth daily.    [provider]  glimepiride (AMARYL) 4 MG tablet Take 4 mg by mouth daily before breakfast.    [provider]  hydrALAZINE (APRESOLINE) 50 MG tablet Take 100 mg by mouth 3 (three) times daily.     [provider]  isosorbide dinitrate (ISORDIL) 20 MG tablet Take 20 mg by mouth daily.    [provider]  labetalol (NORMODYNE) 200 MG tablet Take 200 mg by mouth daily.    [provider]  losartan-hydrochlorothiazide (HYZAAR) 100-25 MG per tablet Take 1 tablet by mouth daily.    [provider]  olmesartan-hydrochlorothiazide (BENICAR HCT) 40-25 MG per tablet Take 1 tablet by mouth daily.    [provider]  pioglitazone-metformin (ACTOPLUS MET) 15-850 MG per tablet Take 1 tablet by mouth daily.    [provider]  sitaGLIPtin (JANUVIA) 100 MG tablet Take 100 mg by mouth daily.    [provider]  tamsulosin (FLOMAX) 0.4 MG CAPS capsule Take 0.4 mg by mouth.    [provider]  torsemide (DEMADEX) 20 MG tablet Take 20 mg by mouth daily.    [provider]  Vitamin D, Ergocalciferol, (DRISDOL) 50000 UNITS CAPS capsule Take 50,000 Units by  mouth every 7 (seven) days.    [provider]    Family History History reviewed. No pertinent family history.  Social History Social History   Tobacco Use  . Smoking status: Never Smoker  . Smokeless tobacco: Never Used  Substance Use Topics  . Alcohol use: Yes    Comment: occasional  . Drug use: No     Allergies   Patient has no known allergies.   Review of Systems Review of Systems Ten systems reviewed and are negative for acute change, except as noted in the HPI.    Physical Exam Updated Vital Signs BP 116/72 (BP Location: Right Arm)   Pulse 77   Temp 98.2 F (36.8 C) (Oral)   Resp 18   SpO2 99%   Physical Exam  Physical Exam    Nursing note and vitals reviewed. Constitutional: He appears well-developed and well-nourished. No distress.  HENT:  Head: Normocephalic and atraumatic.  Eyes: Conjunctivae normal are normal. No scleral icterus.  Neck: Normal range of motion. Neck supple.  Cardiovascular: Normal rate, regular rhythm and normal heart sounds.   Pulmonary/Chest: Effort normal and breath sounds normal. No respiratory distress.  Abdominal: Soft. There is no tenderness.  Musculoskeletal: He exhibits no edema. 1+ pitting edema improved from yesterday. Neurological: He is alert.  Skin: Skin is warm and dry. He is not diaphoretic.  Psychiatric: His behavior is normal.     ED Treatments / Results  Labs (all labs ordered are listed, but only abnormal results are displayed) Labs Reviewed  BASIC METABOLIC PANEL    EKG  EKG Interpretation None       Radiology Dg Chest 2 View  Result Date: 09/14/2017 CLINICAL DATA:  Shortness of breath. EXAM: CHEST  2 VIEW COMPARISON:  Chest x-ray dated June 15, 2013. FINDINGS: The cardiomediastinal silhouette is mildly enlarged. Mild central pulmonary vascular congestion and increased interstitial markings are similar to prior study. Unchanged 1.2 cm nodule in the right midlung on the frontal view. Unchanged 2.1 cm nodule overlying the heart on the lateral view. No consolidation. Trace bilateral pleural effusions. No acute osseous abnormality. IMPRESSION: 1. Mild cardiomegaly and pulmonary interstitial edema with trace bilateral pleural effusions. 2. Relatively unchanged bilateral lung nodules as described above. Given their stability since 2014 without new nodule, these are likely benign. Electronically Signed   By: Titus Dubin M.D.   On: 09/14/2017 10:09    Procedures Procedures (including critical care time)  Medications Ordered in ED Medications - No data to display   Initial Impression / Assessment and Plan / ED Course  I have reviewed the triage vital signs  and the nursing notes.  Pertinent labs & imaging results that were available during my care of the patient were reviewed by me and considered in my medical decision making (see chart for details).  Clinical Course as of Sep 15 1820  Wed Sep 15, 2017  1016 Creatinine: (!) 3.97 [AH]  1016 Creatinine: (!) 3.97 [AH]    Clinical Course User Index [AH] Margarita Mail, PA-C    Patient creatinine is only mildly elevated since yesterday.  Patient has close follow-up with his nephrologist in 5 days this coming Monday.  He also goes for lab draw this coming Friday.  I discussed return precautions with the patient.  He otherwise appears appropriate for discharge at this time.  Final Clinical Impressions(s) / ED Diagnoses   Final diagnoses:  Chronic kidney disease, unspecified CKD stage    ED Discharge Orders  None       Margarita Mail, PA-C 09/15/17 1823    Orlie Dakin, MD 09/16/17 [phone]

## 2018-03-27 ENCOUNTER — Inpatient Hospital Stay (HOSPITAL_BASED_OUTPATIENT_CLINIC_OR_DEPARTMENT_OTHER)
Admission: EM | Admit: 2018-03-27 | Discharge: 2018-04-03 | DRG: 304 | Disposition: A | Discharge status: Home / Self Care | Payer: 59 | Attending: Internal Medicine | Admitting: Internal Medicine

## 2018-03-27 ENCOUNTER — Other Ambulatory Visit: Payer: Self-pay

## 2018-03-27 ENCOUNTER — Emergency Department (HOSPITAL_BASED_OUTPATIENT_CLINIC_OR_DEPARTMENT_OTHER): Payer: 59

## 2018-03-27 ENCOUNTER — Encounter (HOSPITAL_BASED_OUTPATIENT_CLINIC_OR_DEPARTMENT_OTHER): Payer: Self-pay

## 2018-03-27 DIAGNOSIS — Z7984 Long term (current) use of oral hypoglycemic drugs: Secondary | ICD-10-CM

## 2018-03-27 DIAGNOSIS — Z7982 Long term (current) use of aspirin: Secondary | ICD-10-CM

## 2018-03-27 DIAGNOSIS — I248 Other forms of acute ischemic heart disease: Secondary | ICD-10-CM | POA: Diagnosis present

## 2018-03-27 DIAGNOSIS — E873 Alkalosis: Secondary | ICD-10-CM | POA: Diagnosis present

## 2018-03-27 DIAGNOSIS — I160 Hypertensive urgency: Secondary | ICD-10-CM | POA: Diagnosis not present

## 2018-03-27 DIAGNOSIS — N184 Chronic kidney disease, stage 4 (severe): Secondary | ICD-10-CM | POA: Diagnosis present

## 2018-03-27 DIAGNOSIS — E1165 Type 2 diabetes mellitus with hyperglycemia: Secondary | ICD-10-CM | POA: Diagnosis present

## 2018-03-27 DIAGNOSIS — Z8249 Family history of ischemic heart disease and other diseases of the circulatory system: Secondary | ICD-10-CM

## 2018-03-27 DIAGNOSIS — E785 Hyperlipidemia, unspecified: Secondary | ICD-10-CM | POA: Diagnosis present

## 2018-03-27 DIAGNOSIS — Z79899 Other long term (current) drug therapy: Secondary | ICD-10-CM

## 2018-03-27 DIAGNOSIS — I5043 Acute on chronic combined systolic (congestive) and diastolic (congestive) heart failure: Secondary | ICD-10-CM | POA: Diagnosis not present

## 2018-03-27 DIAGNOSIS — Z833 Family history of diabetes mellitus: Secondary | ICD-10-CM

## 2018-03-27 DIAGNOSIS — I130 Hypertensive heart and chronic kidney disease with heart failure and stage 1 through stage 4 chronic kidney disease, or unspecified chronic kidney disease: Secondary | ICD-10-CM | POA: Diagnosis present

## 2018-03-27 DIAGNOSIS — I161 Hypertensive emergency: Principal | ICD-10-CM

## 2018-03-27 DIAGNOSIS — E1122 Type 2 diabetes mellitus with diabetic chronic kidney disease: Secondary | ICD-10-CM | POA: Diagnosis present

## 2018-03-27 DIAGNOSIS — I509 Heart failure, unspecified: Secondary | ICD-10-CM

## 2018-03-27 DIAGNOSIS — N189 Chronic kidney disease, unspecified: Secondary | ICD-10-CM

## 2018-03-27 DIAGNOSIS — E669 Obesity, unspecified: Secondary | ICD-10-CM | POA: Diagnosis present

## 2018-03-27 DIAGNOSIS — G4733 Obstructive sleep apnea (adult) (pediatric): Secondary | ICD-10-CM | POA: Diagnosis present

## 2018-03-27 DIAGNOSIS — I420 Dilated cardiomyopathy: Secondary | ICD-10-CM | POA: Diagnosis present

## 2018-03-27 DIAGNOSIS — R778 Other specified abnormalities of plasma proteins: Secondary | ICD-10-CM

## 2018-03-27 DIAGNOSIS — E878 Other disorders of electrolyte and fluid balance, not elsewhere classified: Secondary | ICD-10-CM | POA: Diagnosis present

## 2018-03-27 DIAGNOSIS — E876 Hypokalemia: Secondary | ICD-10-CM | POA: Diagnosis not present

## 2018-03-27 DIAGNOSIS — I701 Atherosclerosis of renal artery: Secondary | ICD-10-CM

## 2018-03-27 DIAGNOSIS — I340 Nonrheumatic mitral (valve) insufficiency: Secondary | ICD-10-CM | POA: Diagnosis not present

## 2018-03-27 DIAGNOSIS — Z6833 Body mass index (BMI) 33.0-33.9, adult: Secondary | ICD-10-CM

## 2018-03-27 DIAGNOSIS — E1129 Type 2 diabetes mellitus with other diabetic kidney complication: Secondary | ICD-10-CM | POA: Diagnosis present

## 2018-03-27 DIAGNOSIS — N170 Acute kidney failure with tubular necrosis: Secondary | ICD-10-CM | POA: Diagnosis not present

## 2018-03-27 DIAGNOSIS — R7989 Other specified abnormal findings of blood chemistry: Secondary | ICD-10-CM

## 2018-03-27 DIAGNOSIS — I272 Pulmonary hypertension, unspecified: Secondary | ICD-10-CM | POA: Diagnosis present

## 2018-03-27 DIAGNOSIS — E1121 Type 2 diabetes mellitus with diabetic nephropathy: Secondary | ICD-10-CM | POA: Diagnosis not present

## 2018-03-27 DIAGNOSIS — Z9119 Patient's noncompliance with other medical treatment and regimen: Secondary | ICD-10-CM | POA: Diagnosis not present

## 2018-03-27 DIAGNOSIS — R000 Tachycardia, unspecified: Secondary | ICD-10-CM | POA: Diagnosis present

## 2018-03-27 DIAGNOSIS — Z713 Dietary counseling and surveillance: Secondary | ICD-10-CM | POA: Diagnosis not present

## 2018-03-27 DIAGNOSIS — I5022 Chronic systolic (congestive) heart failure: Secondary | ICD-10-CM

## 2018-03-27 DIAGNOSIS — R079 Chest pain, unspecified: Secondary | ICD-10-CM | POA: Diagnosis not present

## 2018-03-27 DIAGNOSIS — R748 Abnormal levels of other serum enzymes: Secondary | ICD-10-CM | POA: Diagnosis not present

## 2018-03-27 LAB — CBC WITH DIFFERENTIAL/PLATELET
BASOS ABS: 0 10*3/uL (ref 0.0–0.1)
BASOS PCT: 0 %
EOS PCT: 2 %
Eosinophils Absolute: 0.1 10*3/uL (ref 0.0–0.7)
HEMATOCRIT: 41 % (ref 39.0–52.0)
Hemoglobin: 13.8 g/dL (ref 13.0–17.0)
Lymphocytes Relative: 15 %
Lymphs Abs: 0.9 10*3/uL (ref 0.7–4.0)
MCH: 30 pg (ref 26.0–34.0)
MCHC: 33.7 g/dL (ref 30.0–36.0)
MCV: 89.1 fL (ref 78.0–100.0)
MONO ABS: 0.6 10*3/uL (ref 0.1–1.0)
MONOS PCT: 9 %
Neutro Abs: 4.4 10*3/uL (ref 1.7–7.7)
Neutrophils Relative %: 74 %
PLATELETS: 175 10*3/uL (ref 150–400)
RBC: 4.6 MIL/uL (ref 4.22–5.81)
RDW: 13.7 % (ref 11.5–15.5)
WBC: 6 10*3/uL (ref 4.0–10.5)

## 2018-03-27 LAB — COMPREHENSIVE METABOLIC PANEL
ALBUMIN: 2.8 g/dL — AB (ref 3.5–5.0)
ALT: 38 U/L (ref 0–44)
ANION GAP: 10 (ref 5–15)
AST: 27 U/L (ref 15–41)
Alkaline Phosphatase: 80 U/L (ref 38–126)
BUN: 29 mg/dL — ABNORMAL HIGH (ref 8–23)
CHLORIDE: 110 mmol/L (ref 98–111)
CO2: 20 mmol/L — ABNORMAL LOW (ref 22–32)
Calcium: 8.4 mg/dL — ABNORMAL LOW (ref 8.9–10.3)
Creatinine, Ser: 3.93 mg/dL — ABNORMAL HIGH (ref 0.61–1.24)
GFR calc Af Amer: 17 mL/min — ABNORMAL LOW (ref >60)
GFR calc non Af Amer: 15 mL/min — ABNORMAL LOW (ref >60)
GLUCOSE: 345 mg/dL — AB (ref 70–99)
Potassium: 3.3 mmol/L — ABNORMAL LOW (ref 3.5–5.1)
SODIUM: 140 mmol/L (ref 135–145)
Total Bilirubin: 0.6 mg/dL (ref 0.3–1.2)
Total Protein: 6.7 g/dL (ref 6.5–8.1)

## 2018-03-27 LAB — TROPONIN I: Troponin I: 0.17 ng/mL (ref <0.03)

## 2018-03-27 LAB — GLUCOSE, CAPILLARY: GLUCOSE-CAPILLARY: 182 mg/dL — AB (ref 70–99)

## 2018-03-27 LAB — MRSA PCR SCREENING: MRSA BY PCR: NEGATIVE

## 2018-03-27 LAB — BRAIN NATRIURETIC PEPTIDE: B Natriuretic Peptide: 2864.7 pg/mL — ABNORMAL HIGH (ref 0.0–100.0)

## 2018-03-27 MED ORDER — LABETALOL HCL 200 MG PO TABS
200.0000 mg | ORAL_TABLET | Freq: Every day | ORAL | Status: DC
  Administered 2018-03-27 – 2018-03-28 (×2): 200 mg via ORAL
  Filled 2018-03-27 (×2): qty 1

## 2018-03-27 MED ORDER — TORSEMIDE 20 MG PO TABS
20.0000 mg | ORAL_TABLET | Freq: Every day | ORAL | Status: DC
  Administered 2018-03-28: 20 mg via ORAL
  Filled 2018-03-27: qty 1

## 2018-03-27 MED ORDER — ATORVASTATIN CALCIUM 20 MG PO TABS
20.0000 mg | ORAL_TABLET | Freq: Every day | ORAL | Status: DC
  Administered 2018-03-27 – 2018-03-31 (×5): 20 mg via ORAL
  Filled 2018-03-27: qty 2
  Filled 2018-03-27 (×3): qty 1
  Filled 2018-03-27: qty 2

## 2018-03-27 MED ORDER — INSULIN ASPART 100 UNIT/ML ~~LOC~~ SOLN
0.0000 [IU] | Freq: Three times a day (TID) | SUBCUTANEOUS | Status: DC
  Administered 2018-03-28: 3 [IU] via SUBCUTANEOUS
  Administered 2018-03-28: 5 [IU] via SUBCUTANEOUS
  Administered 2018-03-28: 3 [IU] via SUBCUTANEOUS
  Administered 2018-03-29: 1 [IU] via SUBCUTANEOUS
  Administered 2018-03-29: 3 [IU] via SUBCUTANEOUS
  Administered 2018-03-29 – 2018-03-30 (×2): 2 [IU] via SUBCUTANEOUS
  Administered 2018-03-30: 3 [IU] via SUBCUTANEOUS
  Administered 2018-03-30: 2 [IU] via SUBCUTANEOUS
  Administered 2018-03-31: 5 [IU] via SUBCUTANEOUS
  Administered 2018-03-31: 2 [IU] via SUBCUTANEOUS
  Administered 2018-03-31: 1 [IU] via SUBCUTANEOUS
  Administered 2018-04-01 (×2): 2 [IU] via SUBCUTANEOUS
  Administered 2018-04-01: 5 [IU] via SUBCUTANEOUS
  Administered 2018-04-02: 1 [IU] via SUBCUTANEOUS
  Administered 2018-04-02: 2 [IU] via SUBCUTANEOUS
  Administered 2018-04-02: 3 [IU] via SUBCUTANEOUS
  Administered 2018-04-03: 2 [IU] via SUBCUTANEOUS

## 2018-03-27 MED ORDER — ONDANSETRON HCL 4 MG/2ML IJ SOLN: 4.0000 mg | Freq: Four times a day (QID) | INTRAMUSCULAR | Status: DC | PRN

## 2018-03-27 MED ORDER — LABETALOL HCL 5 MG/ML IV SOLN
20.0000 mg | Freq: Once | INTRAVENOUS | Status: AC
  Administered 2018-03-27: 20 mg via INTRAVENOUS
  Filled 2018-03-27: qty 4

## 2018-03-27 MED ORDER — TAMSULOSIN HCL 0.4 MG PO CAPS
0.4000 mg | ORAL_CAPSULE | Freq: Every day | ORAL | Status: DC
  Administered 2018-03-27 – 2018-04-02 (×7): 0.4 mg via ORAL
  Filled 2018-03-27 (×7): qty 1

## 2018-03-27 MED ORDER — DOXAZOSIN MESYLATE 1 MG PO TABS: 4.0000 mg | ORAL_TABLET | Freq: Every day | ORAL | Status: DC

## 2018-03-27 MED ORDER — FUROSEMIDE 10 MG/ML IJ SOLN
20.0000 mg | Freq: Once | INTRAMUSCULAR | Status: AC
  Administered 2018-03-27: 20 mg via INTRAVENOUS
  Filled 2018-03-27: qty 2

## 2018-03-27 MED ORDER — OMEGA-3-ACID ETHYL ESTERS 1 G PO CAPS
1.0000 g | ORAL_CAPSULE | Freq: Every day | ORAL | Status: DC
  Administered 2018-03-27 – 2018-04-03 (×8): 1 g via ORAL
  Filled 2018-03-27 (×8): qty 1

## 2018-03-27 MED ORDER — CLONIDINE HCL 0.1 MG PO TABS: 0.1000 mg | ORAL_TABLET | Freq: Once | ORAL | Status: DC

## 2018-03-27 MED ORDER — TAMSULOSIN HCL 0.4 MG PO CAPS: 0.4000 mg | ORAL_CAPSULE | Freq: Every day | ORAL | Status: DC

## 2018-03-27 MED ORDER — ACETAMINOPHEN 325 MG PO TABS
650.0000 mg | ORAL_TABLET | Freq: Four times a day (QID) | ORAL | Status: DC | PRN
  Administered 2018-03-28: 650 mg via ORAL
  Filled 2018-03-27: qty 2

## 2018-03-27 MED ORDER — DILTIAZEM HCL ER COATED BEADS 120 MG PO CP24
240.0000 mg | ORAL_CAPSULE | Freq: Every day | ORAL | Status: DC
  Administered 2018-03-27 – 2018-03-28 (×2): 240 mg via ORAL
  Filled 2018-03-27 (×2): qty 2

## 2018-03-27 MED ORDER — ASPIRIN EC 81 MG PO TBEC
81.0000 mg | DELAYED_RELEASE_TABLET | Freq: Every day | ORAL | Status: DC
  Administered 2018-03-28 – 2018-04-03 (×7): 81 mg via ORAL
  Filled 2018-03-27 (×7): qty 1

## 2018-03-27 MED ORDER — ONDANSETRON HCL 4 MG PO TABS: 4.0000 mg | ORAL_TABLET | Freq: Four times a day (QID) | ORAL | Status: DC | PRN

## 2018-03-27 MED ORDER — ACETAMINOPHEN 650 MG RE SUPP: 650.0000 mg | Freq: Four times a day (QID) | RECTAL | Status: DC | PRN

## 2018-03-27 MED ORDER — HYDRALAZINE HCL 50 MG PO TABS
100.0000 mg | ORAL_TABLET | Freq: Three times a day (TID) | ORAL | Status: DC
  Administered 2018-03-28 (×2): 100 mg via ORAL
  Filled 2018-03-27 (×2): qty 2

## 2018-03-27 MED ORDER — NITROGLYCERIN IN D5W 200-5 MCG/ML-% IV SOLN
0.0000 ug/min | Freq: Once | INTRAVENOUS | Status: AC
  Administered 2018-03-27: 30 ug/min via INTRAVENOUS
  Filled 2018-03-27: qty 250

## 2018-03-27 MED ORDER — HYDRALAZINE HCL 25 MG PO TABS
100.0000 mg | ORAL_TABLET | Freq: Once | ORAL | Status: AC
  Administered 2018-03-27: 100 mg via ORAL
  Filled 2018-03-27: qty 4

## 2018-03-27 NOTE — Progress Notes (Signed)
Pt arrived at roughly 2000. Pt alert and ambulated to bed without assist. Pt hooked up to monitor and changed gown without problems. Pt friendly and in good spirits. Complaining of 3/10 dull headache. Family arrived at at bedside. Pleasant and supportive.   Current VS: HR NSR 89 RR 20 O2 99% on RA BP 213/174 (189) Pt has Nitro infusing @ 57mcg/min & NS running @20mL /hr.  Admitting MD paged and waiting for response.

## 2018-03-27 NOTE — ED Notes (Signed)
Report given to Minette Brine, Therapist, sports at [facility].

## 2018-03-27 NOTE — ED Provider Notes (Signed)
[facility] EMERGENCY DEPARTMENT Provider Note   CSN: [id number] Arrival date & time: 03/27/18  1535     History   Chief Complaint Chief Complaint  Patient presents with  . Shortness of Breath    HPI Anthony [name] is a 62 y.o. male.  Patient with history of congestive heart failure, diabetes, hypertension on multiple medications --presents to the emergency department today with gradual onset of shortness of breath starting about 2 weeks ago.  Symptoms have continued to worsen.  He states that he feels like he is wheezing.  He states that he has been taking his blood pressure medications however did not take them today.  He denies any fevers or cough.  He denies chest pain.  He states that he is able to lie flat to sleep at night and denies lower extremity swelling.  He states that his weights have been remained stable recently.  On previous visit in January he had gained about 15 pounds and was in the 260's; he reports recently being in the 240's.  He does not have any abdominal pain, nausea, vomiting, or diarrhea.  Patient denies risk factors for pulmonary embolism including: unilateral leg swelling, history of DVT/PE/other blood clots, use of exogenous hormones, recent immobilizations, recent surgery, recent travel (>4hr segment), malignancy, hemoptysis. The course is constant. Aggravating factors: exertion. Alleviating factors: none.         Past Medical History:  Diagnosis Date  . CHF (congestive heart failure) ([place])   . Diabetes mellitus without complication ([place])   . Hypertension   . Renal disorder     Patient Active Problem List   Diagnosis Date Noted  . Acute on chronic heart failure ([place]) 09/14/2017    History reviewed. No pertinent surgical history.      Home Medications    Prior to Admission medications   Medication Sig Start Date End Date Taking? Authorizing Provider  aspirin 81 MG tablet Take 81 mg by mouth daily.    [provider]    atorvastatin (LIPITOR) 20 MG tablet Take 20 mg by mouth daily.    [provider]  benzonatate (TESSALON) 200 MG capsule Take 200 mg by mouth 3 (three) times daily as needed for cough.    [provider]  cloNIDine (CATAPRES) 0.1 MG tablet Take 0.1 mg by mouth 2 (two) times daily.    [provider]  diltiazem (CARDIZEM CD) 240 MG 24 hr capsule Take 240 mg by mouth daily.    [provider]  doxazosin (CARDURA) 4 MG tablet Take 4 mg by mouth daily.    [provider]  fish oil-omega-3 fatty acids 1000 MG capsule Take 1 g by mouth daily.    [provider]  furosemide (LASIX) 20 MG tablet Take 20 mg by mouth daily.    [provider]  glimepiride (AMARYL) 4 MG tablet Take 4 mg by mouth daily before breakfast.    [provider]  hydrALAZINE (APRESOLINE) 50 MG tablet Take 100 mg by mouth 3 (three) times daily.     [provider]  isosorbide dinitrate (ISORDIL) 20 MG tablet Take 20 mg by mouth daily.    [provider]  labetalol (NORMODYNE) 200 MG tablet Take 200 mg by mouth daily.    [provider]  losartan-hydrochlorothiazide (HYZAAR) 100-25 MG per tablet Take 1 tablet by mouth daily.    [provider]  olmesartan-hydrochlorothiazide (BENICAR HCT) 40-25 MG per tablet Take 1 tablet by mouth daily.  [provider]  pioglitazone-metformin (ACTOPLUS MET) 15-850 MG per tablet Take 1 tablet by mouth daily.    [provider]  sitaGLIPtin (JANUVIA) 100 MG tablet Take 100 mg by mouth daily.    [provider]  tamsulosin (FLOMAX) 0.4 MG CAPS capsule Take 0.4 mg by mouth.    [provider]  torsemide (DEMADEX) 20 MG tablet Take 20 mg by mouth daily.    [provider]  Vitamin D, Ergocalciferol, (DRISDOL) 50000 UNITS CAPS capsule Take 50,000 Units by mouth every 7 (seven) days.    [provider]    Family History No family history on  file.  Social History Social History   Tobacco Use  . Smoking status: Never Smoker  . Smokeless tobacco: Never Used  Substance Use Topics  . Alcohol use: Yes    Comment: occasional  . Drug use: No     Allergies   Patient has no known allergies.   Review of Systems Review of Systems  Constitutional: Negative for diaphoresis and fever.  Eyes: Negative for redness.  Respiratory: Positive for shortness of breath. Negative for cough.   Cardiovascular: Negative for chest pain, palpitations and leg swelling.  Gastrointestinal: Negative for abdominal pain, nausea and vomiting.  Genitourinary: Negative for dysuria.  Musculoskeletal: Negative for back pain and neck pain.  Skin: Negative for rash.  Neurological: Negative for syncope, light-headedness and headaches.  Psychiatric/Behavioral: The patient is not nervous/anxious.      Physical Exam Updated Vital Signs BP (!) 236/160 (BP Location: Right Arm) Comment: Patient has not taken his BP medication today  Pulse (!) 113   Temp 98.6 F (37 C) (Oral)   Resp 20   Ht 6' (1.829 m)   Wt 112.5 kg (248 lb)   SpO2 99%   BMI 33.63 kg/m   Physical Exam  Constitutional: He appears well-developed and well-nourished.  HENT:  Head: Normocephalic and atraumatic.  Mouth/Throat: Mucous membranes are normal. Mucous membranes are not dry.  Eyes: Conjunctivae are normal.  Neck: Trachea normal and normal range of motion. Neck supple. Normal carotid pulses and no JVD present. No muscular tenderness present. Carotid bruit is not present. No tracheal deviation present.  Cardiovascular: Regular rhythm, S1 normal, S2 normal, normal heart sounds and intact distal pulses. Tachycardia present. Exam reveals no distant heart sounds and no decreased pulses.  No murmur heard. Pulmonary/Chest: Effort normal and breath sounds normal. No respiratory distress. He has no wheezes. He has no rhonchi. He has no rales. He exhibits no tenderness.  Abdominal:  Soft. Normal aorta and bowel sounds are normal. There is no tenderness. There is no rebound and no guarding.  Musculoskeletal: He exhibits no edema.       Right lower leg: He exhibits no tenderness and no edema.       Left lower leg: He exhibits no tenderness and no edema.  No clinical signs of DVT.  Neurological: He is alert.  Skin: Skin is warm and dry. He is not diaphoretic. No cyanosis. No pallor.  Psychiatric: He has a normal mood and affect.  Nursing note and vitals reviewed.    ED Treatments / Results  Labs (all labs ordered are listed, but only abnormal results are displayed) Labs Reviewed  COMPREHENSIVE METABOLIC PANEL - Abnormal; Notable for the following components:      Result Value   Potassium 3.3 (*)    CO2 20 (*)    Glucose, Bld 345 (*)    BUN 29 (*)  Creatinine, Ser 3.93 (*)    Calcium 8.4 (*)    Albumin 2.8 (*)    GFR calc non Af Amer 15 (*)    GFR calc Af Amer 17 (*)    All other components within normal limits  BRAIN NATRIURETIC PEPTIDE - Abnormal; Notable for the following components:   B Natriuretic Peptide 2,864.7 (*)    All other components within normal limits  TROPONIN I - Abnormal; Notable for the following components:   Troponin I 0.17 (*)    All other components within normal limits  CBC WITH DIFFERENTIAL/PLATELET    EKG EKG Interpretation  Date/Time:  Sunday March 27 2018 16:09:55 EDT Ventricular Rate:  102 PR Interval:    QRS Duration: 99 QT Interval:  342 QTC Calculation: 446 R Axis:   50 Text Interpretation:  Sinus tachycardia Probable left atrial enlargement Nonspecific T abnormalities, diffuse leads Baseline wander in lead(s) aVF V6 since last tracing no significant change Confirmed by Malvin Johns [phone]) on 03/27/2018 5:00:26 PM   Radiology Dg Chest 2 View  Result Date: 03/27/2018 CLINICAL DATA:  Wheezing for 2 weeks EXAM: CHEST - 2 VIEW COMPARISON:  This 09/14/2017 FINDINGS: Mild cardiomegaly is unchanged. Pulmonary arteries  remain enlarged. There is central pulmonary vascular congestion without overt edema. No pleural effusion or pneumothorax. No focal consolidation. IMPRESSION: Cardiomegaly with pulmonary vascular congestion, but no overt pulmonary edema. Electronically Signed   By: Ulyses Jarred M.D.   On: 03/27/2018 16:06    Procedures Procedures (including critical care time)  Medications Ordered in ED Medications  labetalol (NORMODYNE,TRANDATE) injection 20 mg (20 mg Intravenous Given 03/27/18 1614)  furosemide (LASIX) injection 20 mg (20 mg Intravenous Given 03/27/18 1635)  hydrALAZINE (APRESOLINE) tablet 100 mg (100 mg Oral Given 03/27/18 1652)  nitroGLYCERIN 50 mg in dextrose 5 % 250 mL (0.2 mg/mL) infusion (30 mcg/min Intravenous New Bag/Given 03/27/18 1817)     Initial Impression / Assessment and Plan / ED Course  I have reviewed the triage vital signs and the nursing notes.  Pertinent labs & imaging results that were available during my care of the patient were reviewed by me and considered in my medical decision making (see chart for details).     Patient seen and examined. Work-up initiated. Medications ordered.  Patient is on labetalol at home, will give dose IV. EKG reviewed.   Vital signs reviewed and are as follows: BP (!) 236/160 (BP Location: Right Arm) Comment: Patient has not taken his BP medication today  Pulse (!) 113   Temp 98.6 F (37 C) (Oral)   Resp 20   Ht 6' (1.829 m)   Wt 112.5 kg (248 lb)   SpO2 99%   BMI 33.63 kg/m   5:19 PM patient with vascular congestion on x-ray with markedly elevated BNP and elevated troponin likely 2/2 CKD + CHF + elevated BP.  Discussed need for admission with patient and family at bedside.  Patient is currently deciding whether he will allow admission or not.  Blood pressure slightly improved to the 762G systolic.  3:15 PM Patient agreeable to admission. Will discuss with hospitalist service. Will start on IV NTG for BP control.   6:26 PM Spoke  to Dr. Maudie Mercury who accepts patient.   CRITICAL CARE Performed by: Faustino Congress Total critical care time: 35 minutes Critical care time was exclusive of separately billable procedures and treating other patients. Critical care was necessary to treat or prevent imminent or life-threatening deterioration. Critical care was time spent  personally by me on the following activities: development of treatment plan with patient and/or surrogate as well as nursing, discussions with consultants, evaluation of patient's response to treatment, examination of patient, obtaining history from patient or surrogate, ordering and performing treatments and interventions, ordering and review of laboratory studies, ordering and review of radiographic studies, pulse oximetry and re-evaluation of patient's condition.   Final Clinical Impressions(s) / ED Diagnoses   Final diagnoses:  Hypertensive emergency  Chronic kidney disease, unspecified CKD stage  Elevated troponin  Acute heart failure, unspecified heart failure type ([place])   Admit.   ED Discharge Orders    None       Carlisle Cater, Hershal Coria 03/27/18 1827    Malvin Johns, MD 03/27/18 [phone]

## 2018-03-27 NOTE — ED Notes (Signed)
ED Provider at bedside. 

## 2018-03-27 NOTE — Progress Notes (Signed)
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  BNP 2, 864.7 and trop 0.17,  ekg ST at 100, nl axis, nl pr, t inversion in v4-6.  CXR Cardiomegaly with pulmonary vascular congestion, but no overt pulmonary edema. Pt will be admitted for hypertensive emergency.

## 2018-03-27 NOTE — ED Triage Notes (Signed)
Pt reports increased SOB x 2 weeks, feeling like he is wheezing. Denies chest pain

## 2018-03-27 NOTE — ED Notes (Signed)
Patient refused to take Clonidine stating that it will make him sick.  EDP notified.

## 2018-03-27 NOTE — ED Notes (Signed)
Critical trop of 0.17 given to both EDP and primary RN

## 2018-03-27 NOTE — H&P (Signed)
History and Physical    Anthony [name] JJK:[id number] DOB: 1955-11-18 DOA: 03/27/2018  PCP: Secundino Ginger, PA-C  Patient coming from: Home.  Chief Complaint: Shortness of breath.  HPI: Anthony [name] is a 62 y.o. male with history of chronic kidney disease likely stage IV patient does not recall his last creatinine follows of blood nephrologist at [facility] no recent blood work in our system with history of diabetes mellitus type 2 hypertension hyperlipidemia sleep apnea presents to the ER at [facility] with complaints of shortness of breath.  Patient has been having exertional shortness of breath and also orthopnea over the last 2 weeks which has been gradually worsening.  Denies any chest pain or productive cough.  Denies any headache or any visual symptoms or focal deficits.  Patient states he may have missed couple of his doses of his antihypertensive last 2 days.  ED Course: In the ER patient blood pressure was more than 371 systolic and diastolic 02/14/6788 20.  Was started on nitroglycerin infusion and since patient had shortness of breath and chest x-ray showing congestion Lasix 20 mg IV was given and also hydralazine 1 dose p.o. was given which she had not taken today.  1 dose of IV labetalol dose was given.  At the time of my exam patient is not in distress denies any chest pain.  Patient admitted for hypertensive urgency and acute congestive heart failure.  Review of Systems: As per HPI, rest all negative.   Past Medical History:  Diagnosis Date  . CHF (congestive heart failure) ([place])   . Diabetes mellitus without complication ([place])   . Hypertension   . Renal disorder     History reviewed. No pertinent surgical history.   reports that he has never smoked. He has never used smokeless tobacco. He reports that he drinks alcohol. He reports that he does not use drugs.  No Known Allergies  Family History  Problem Relation Age of Onset  . Congestive Heart Failure Mother    . Diabetes Mellitus II Father   . Hypertension Father     Prior to Admission medications   Medication Sig Start Date End Date Taking? Authorizing Provider  aspirin 81 MG tablet Take 81 mg by mouth daily.   Yes [provider]  atorvastatin (LIPITOR) 20 MG tablet Take 20 mg by mouth daily.   Yes [provider]  diltiazem (CARDIZEM CD) 240 MG 24 hr capsule Take 240 mg by mouth daily.   Yes [provider]  doxazosin (CARDURA) 4 MG tablet Take 4 mg by mouth daily.   Yes [provider]  fish oil-omega-3 fatty acids 1000 MG capsule Take 1 g by mouth daily.   Yes [provider]  furosemide (LASIX) 20 MG tablet Take 20 mg by mouth daily.   Yes [provider]  glimepiride (AMARYL) 4 MG tablet Take 4 mg by mouth daily before breakfast.   Yes [provider]  hydrALAZINE (APRESOLINE) 50 MG tablet Take 100 mg by mouth 3 (three) times daily.    Yes [provider]  isosorbide dinitrate (ISORDIL) 20 MG tablet Take 20 mg by mouth daily.   Yes [provider]  labetalol (NORMODYNE) 200 MG tablet Take 200 mg by mouth daily.   Yes [provider]  losartan-hydrochlorothiazide (HYZAAR) 100-25 MG per tablet Take 1 tablet by mouth daily.   Yes [provider]  tamsulosin (FLOMAX) 0.4 MG CAPS capsule Take 0.4 mg by mouth.  Yes [provider]  torsemide (DEMADEX) 20 MG tablet Take 20 mg by mouth daily.   Yes [provider]  Vitamin D, Ergocalciferol, (DRISDOL) 50000 UNITS CAPS capsule Take 50,000 Units by mouth every 7 (seven) days.   Yes [provider]    Physical Exam: Vitals:   03/27/18 1900 03/27/18 1920 03/27/18 2010 03/27/18 2030  BP: (!) 176/132 (!) 192/134  (!) 215/151  Pulse: 85 83    Resp: 12 (!) 21  17  Temp:   98.5 F (36.9 C)   TempSrc:   Oral   SpO2: 97% 96%  98%  Weight:      Height:          Constitutional: Moderately built and nourished. Vitals:    03/27/18 1900 03/27/18 1920 03/27/18 2010 03/27/18 2030  BP: (!) 176/132 (!) 192/134  (!) 215/151  Pulse: 85 83    Resp: 12 (!) 21  17  Temp:   98.5 F (36.9 C)   TempSrc:   Oral   SpO2: 97% 96%  98%  Weight:      Height:       Eyes: Anicteric no pallor. ENMT: No discharge from the ears eyes nose or mouth. Neck: No mass felt.  No neck rigidity but no JVD appreciated. Respiratory: No rhonchi or crepitations. Cardiovascular: S1-S2 heard no murmurs appreciated. Abdomen: Soft nontender bowel sounds present. Musculoskeletal: Mild edema of both lower extremities. Skin: No rash. Neurologic: Alert awake oriented to time place and person.  Moves all extremities 5 x 5.  No facial asymmetry tongue is midline. Psychiatric: Appears normal per normal affect.   Labs on Admission: I have personally reviewed following labs and imaging studies  CBC: Recent Labs  Lab 03/27/18 1606  WBC 6.0  NEUTROABS 4.4  HGB 13.8  HCT 41.0  MCV 89.1  PLT 542   Basic Metabolic Panel: Recent Labs  Lab 03/27/18 1606  NA 140  K 3.3*  CL 110  CO2 20*  GLUCOSE 345*  BUN 29*  CREATININE 3.93*  CALCIUM 8.4*   GFR: Estimated Creatinine Clearance: 24.9 mL/min (A) (by C-G formula based on SCr of 3.93 mg/dL (H)). Liver Function Tests: Recent Labs  Lab 03/27/18 1606  AST 27  ALT 38  ALKPHOS 80  BILITOT 0.6  PROT 6.7  ALBUMIN 2.8*   No results for input(s): LIPASE, AMYLASE in the last 168 hours. No results for input(s): AMMONIA in the last 168 hours. Coagulation Profile: No results for input(s): INR, PROTIME in the last 168 hours. Cardiac Enzymes: Recent Labs  Lab 03/27/18 1606  TROPONINI 0.17*   BNP (last 3 results) No results for input(s): PROBNP in the last 8760 hours. HbA1C: No results for input(s): HGBA1C in the last 72 hours. CBG: No results for input(s): GLUCAP in the last 168 hours. Lipid Profile: No results for input(s): CHOL, HDL, LDLCALC, TRIG, CHOLHDL, LDLDIRECT in the last  72 hours. Thyroid Function Tests: No results for input(s): TSH, T4TOTAL, FREET4, T3FREE, THYROIDAB in the last 72 hours. Anemia Panel: No results for input(s): VITAMINB12, FOLATE, FERRITIN, TIBC, IRON, RETICCTPCT in the last 72 hours. Urine analysis: No results found for: COLORURINE, APPEARANCEUR, LABSPEC, PHURINE, GLUCOSEU, HGBUR, BILIRUBINUR, KETONESUR, PROTEINUR, UROBILINOGEN, NITRITE, LEUKOCYTESUR Sepsis Labs: @LABRCNTIP (procalcitonin:4,lacticidven:4) )No results found for this or any previous visit (from the past 240 hour(s)).   Radiological Exams on Admission: Dg Chest 2 View  Result Date: 03/27/2018 CLINICAL DATA:  Wheezing for 2 weeks EXAM: CHEST - 2 VIEW COMPARISON:  This  09/14/2017 FINDINGS: Mild cardiomegaly is unchanged. Pulmonary arteries remain enlarged. There is central pulmonary vascular congestion without overt edema. No pleural effusion or pneumothorax. No focal consolidation. IMPRESSION: Cardiomegaly with pulmonary vascular congestion, but no overt pulmonary edema. Electronically Signed   By: Ulyses Jarred M.D.   On: 03/27/2018 16:06    EKG: Independently reviewed.  Sinus tachycardia with nonspecific T wave changes.  Assessment/Plan Principal Problem:   Hypertensive emergency Active Problems:   Acute CHF (congestive heart failure) (HCC)   DM (diabetes mellitus), type 2 with renal complications (HCC)   OSA (obstructive sleep apnea)   Elevated troponin   Hypertensive urgency    1. Hypertensive urgency -not sure if patient's compliance with medications.  Patient is placed on nitroglycerin infusion.  Has not taken his medicines today and his oral dose of hydralazine was given in the ER.  Will dose and fall labetalol Cardizem tamsulosin.  Patient was recently taken off Hyzaar probably secondary renal failure.  Patient also device ordered which is on hold due to patient being on nitroglycerin infusion.  Patient did receive 1 dose of Lasix IV in the ER.  Will continue  torsemide.  May need further IV Lasix based on the response and patient's clinical condition.  Extensively discussed with patient's nurse about slowly decreasing blood pressure this time. 2. Acute congestive heart failure likely from uncontrolled hypertension.  We will cycle cardiac markers check 2D echo.  Patient is on nitroglycerin infusion and 1 dose of Lasix was given.  Closely observe. 3. Sleep apnea on CPAP. 4. Diabetes mellitus type 2 -patient states he has not been taking his antidiabetic medications for last many weeks.  Will keep patient on sliding scale coverage check hemoglobin A1c. 5. Elevated troponin we will cycle cardiac markers check 2D echo. 6. Hyperlipidemia on statins.   DVT prophylaxis: SCDs for now until blood pressure gets improved. Code Status: Full code. Family Communication: Patient's wife. Disposition Plan: Home. Consults called: None. Admission status: Inpatient.   Rise Patience MD [facility] Pager [phone].  If 7PM-7AM, please contact night-coverage [email or web address] Password [facility]  03/27/2018, 9:31 PM

## 2018-03-27 NOTE — ED Notes (Signed)
Attempted to call report for floor nurse.

## 2018-03-28 ENCOUNTER — Inpatient Hospital Stay (HOSPITAL_COMMUNITY): Payer: 59

## 2018-03-28 DIAGNOSIS — I340 Nonrheumatic mitral (valve) insufficiency: Secondary | ICD-10-CM

## 2018-03-28 LAB — BASIC METABOLIC PANEL WITH GFR
Anion gap: 7 (ref 5–15)
BUN: 27 mg/dL — ABNORMAL HIGH (ref 8–23)
CO2: 23 mmol/L (ref 22–32)
Calcium: 8.2 mg/dL — ABNORMAL LOW (ref 8.9–10.3)
Chloride: 113 mmol/L — ABNORMAL HIGH (ref 98–111)
Creatinine, Ser: 3.87 mg/dL — ABNORMAL HIGH (ref 0.61–1.24)
GFR calc Af Amer: 18 mL/min — ABNORMAL LOW
GFR calc non Af Amer: 15 mL/min — ABNORMAL LOW
Glucose, Bld: 224 mg/dL — ABNORMAL HIGH (ref 70–99)
Potassium: 3.1 mmol/L — ABNORMAL LOW (ref 3.5–5.1)
Sodium: 143 mmol/L (ref 135–145)

## 2018-03-28 LAB — TROPONIN I
TROPONIN I: 0.15 ng/mL — AB (ref <0.03)
Troponin I: 0.14 ng/mL (ref <0.03)
Troponin I: 0.13 ng/mL

## 2018-03-28 LAB — CBC
HCT: 35.6 % — ABNORMAL LOW (ref 39.0–52.0)
Hemoglobin: 11.6 g/dL — ABNORMAL LOW (ref 13.0–17.0)
MCH: 29.3 pg (ref 26.0–34.0)
MCHC: 32.6 g/dL (ref 30.0–36.0)
MCV: 89.9 fL (ref 78.0–100.0)
PLATELETS: 177 10*3/uL (ref 150–400)
RBC: 3.96 MIL/uL — AB (ref 4.22–5.81)
RDW: 13.9 % (ref 11.5–15.5)
WBC: 4.9 10*3/uL (ref 4.0–10.5)

## 2018-03-28 LAB — ECHOCARDIOGRAM COMPLETE
Height: 72 in
Weight: 3922.42 [oz_av]

## 2018-03-28 LAB — HIV ANTIBODY (ROUTINE TESTING W REFLEX): HIV SCREEN 4TH GENERATION: NONREACTIVE

## 2018-03-28 LAB — GLUCOSE, CAPILLARY
GLUCOSE-CAPILLARY: 201 mg/dL — AB (ref 70–99)
GLUCOSE-CAPILLARY: 200 mg/dL — AB (ref 70–99)
Glucose-Capillary: 286 mg/dL — ABNORMAL HIGH (ref 70–99)
Glucose-Capillary: 202 mg/dL — ABNORMAL HIGH (ref 70–99)

## 2018-03-28 LAB — HEMOGLOBIN A1C
HEMOGLOBIN A1C: 11.1 % — AB (ref 4.8–5.6)
MEAN PLASMA GLUCOSE: 271.87 mg/dL

## 2018-03-28 LAB — TSH: TSH: 0.728 u[IU]/mL (ref 0.350–4.500)

## 2018-03-28 MED ORDER — METOPROLOL SUCCINATE ER 25 MG PO TB24
25.0000 mg | ORAL_TABLET | Freq: Once | ORAL | Status: AC
  Administered 2018-03-28: 25 mg via ORAL
  Filled 2018-03-28: qty 1

## 2018-03-28 MED ORDER — ISOSORB DINITRATE-HYDRALAZINE 20-37.5 MG PO TABS
1.0000 | ORAL_TABLET | Freq: Three times a day (TID) | ORAL | Status: DC
  Administered 2018-03-28 – 2018-03-29 (×5): 1 via ORAL
  Filled 2018-03-28 (×7): qty 1

## 2018-03-28 MED ORDER — METOPROLOL SUCCINATE ER 50 MG PO TB24
50.0000 mg | ORAL_TABLET | Freq: Every day | ORAL | Status: DC
  Administered 2018-03-28 – 2018-03-29 (×2): 50 mg via ORAL
  Filled 2018-03-28: qty 2
  Filled 2018-03-28: qty 1

## 2018-03-28 NOTE — Progress Notes (Signed)
  Echocardiogram 2D Echocardiogram has been performed.  Anthony [name] 03/28/2018, 9:22 AM

## 2018-03-28 NOTE — Progress Notes (Signed)
CRITICAL VALUE ALERT  Critical Value: troponin 0.15 Date & Time Notied:  7/212/19 0710  Provider Notified: Verlon Au  Orders Received/Actions taken: Pending

## 2018-03-28 NOTE — Progress Notes (Signed)
Inpatient Diabetes Program Recommendations  AACE/ADA: New Consensus Statement on Inpatient Glycemic Control (2015)  Target Ranges:  Prepandial:   less than 140 mg/dL      Peak postprandial:   less than 180 mg/dL (1-2 hours)      Critically ill patients:  140 - 180 mg/dL   Review of Glycemic Control  Diabetes history: DM 2 Outpatient Diabetes medications: Glimepiride 4 mg Daily Current orders for Inpatient glycemic control: Novolog 0-9 units tid with meals Insurance: [place]  A1c 11.1% this admission. Patient has been on Victoza in the past with daily injections. Last PCP visit was 6 weeks ago and no changes were made to regimen. Discussed with patient the physiology of DM with patient along with the comorbidities he has with renal and cardiovascular systems.  Discussed diet modifications with CHO and reduced sodium intake. Patient had not been checking glucose at home, his wife use to check his glucose but she made his finger sore one time and he has not let her since. Patient had to get use to "sticking himself with victoza". He mentioned it had stung, however patient was injecting right after it was refrigerated. Discussed tips on reducing the stinging by having it room temperature.   At the end of conversation patient maybe open to basal insulin at home. With commercial insurance can get Lantus Solostar insulin pen with $0 copay with copay card. To reduce fingerstick option spoke with patient about the [facility] continuous glucose monitor with his insurance would pay $46/month for the sensors if on insulin. Gave patient a handout on the [employer] device.  Will see patient tomorrow. Patient to discuss insulin further with MD tomorrow.  Thanks,  Tama Headings RN, MSN, BC-ADM, [facility] Inpatient Diabetes Coordinator Team Pager [phone] (8a-5p)

## 2018-03-28 NOTE — Progress Notes (Signed)
MD gave verbal order to give Cardizem early at 8am rounds due to elevated BP.

## 2018-03-28 NOTE — Progress Notes (Signed)
[facility] PROGRESS NOTE  Anthony [name] ONG:[id number] DOB: 07/03/1956 DOA: 03/27/2018 PCP: Secundino Ginger, PA-C   Narrative: 87 aa m-multiple ED visits last 1 09/15/2017--9 at that time for?  Acute CHF Diabetes mellitus type 2 for about 20 years-started seeing a nephrologist in [facility] about 6 months ago because of worsening kidney function-hypertensive but does not take his meds Admitted with hypertensive urgency 7/21  Last had an echo [facility] which showed an EF of 40 to 45% in 2014    A & Plan Hypertensive urgency-evidences of endorgan damage since January 2019-seeing a nephrologist currently-reimplemented home meds which he is not compliant with-blood pressures are now in the 150 range would not lower beyond this given need for renal perfusion-await echocardiogram ordered this admission--- patient is not on appropriate doses of labetalol in the outpatient setting-is only taking this 200 once daily when he does take it-I will hold his Hyzaar given his AKI and place him on metoprolol XL 50 daily in addition to Cardizem 240-he might be able to use Isordil depending on his pressures in the near future  Diabetes mellitus type 2 A1c 11.1-was on Victoza prior to admission unclear if he was taking it-not a candidate for metformin-May benefit from a.m. Amaryl-this is renally cleared so there is a risk of prolonged hypoglycemia-may need endocrinology input-if he is willing we can use low-dose regular insulin and he will need education for the same  Obstructive sleep apnea OHSS habitus-has a CPAP machine but once again is noncompliant-we will place an auto titration overnight  Body mass index is 33.25 kg/m.  Could do with weight loss counseling  hypokalemia-would not supplement in the setting of AKI-monitor trends in the morning with am magensium as well  Type II demand ischemia-trend in a setting of AECHF likely from CHF-BNP is 2864-he is not having any chest pain-EKG this  morning independently reviewed as an non-suggestive of ischemia  Hyperchloremic contraction alkalosis-will probably need to replace his diuretic-see above discussion regarding the same-obtain LFT a.m.   Patient is stabilized to transfer to telemetry  DVT prophylaxis: Lovenox code Status: Full code family Communication: None disposition Plan: Inpatient   Brin Ruggerio, MD  [facility] Direct contact: [phone] --Via amion app OR  --[email or web address]; password [email or web address]  7PM-7AM contact night coverage as above 03/28/2018, 11:41 AM  LOS: 1 day   Consultants:  None at present  Procedures:  None at present  Antimicrobials:  None  Interval history/Subjective: Awake alert-nitro GTT turned off overnight Blood pressure is better improved with reinitiation of meds No chest pain No fever No chills No nausea No cough at present time--held Lasix this morning  Objective:  Vitals:  Vitals:   03/28/18 1100 03/28/18 1105  BP:  (!) 159/102  Pulse:    Resp: 14 15  Temp:    SpO2: 96% 99%    Exam:  . EOMI NCAT, scleral icterus, no pallor, Mallampati 4, no JVD, S1-S2 no murmur rub or gallop, abdomen obese nontender nondistended no rebound cannot appreciate HSM, no lower extremity edema, CTA B without added sounds, orientation and mentation intact, euthymic congruent   I have personally reviewed the following:   Labs:  Potassium 3.1 BUN/creatinine 27/3.8 down from 39/3.9 GFR 15  Sodium 143, chloride 113 above prior baseline  Imaging studies:  Echocardiogram is pending  Medical tests:  None  Test discussed with performing physician:  None  Decision to obtain old records: yes  Review and summation of old records:  no  Scheduled  Meds: . aspirin EC  81 mg Oral Daily  . atorvastatin  20 mg Oral Daily  . diltiazem  240 mg Oral Daily  . hydrALAZINE  100 mg Oral Q8H  . insulin aspart  0-9 Units Subcutaneous TID WC  . labetalol  200 mg Oral Daily  . omega-3 acid  ethyl esters  1 g Oral Daily  . tamsulosin  0.4 mg Oral QPC supper   Continuous Infusions:  Principal Problem:   Hypertensive emergency Active Problems:   Acute CHF (congestive heart failure) (HCC)   DM (diabetes mellitus), type 2 with renal complications (HCC)   OSA (obstructive sleep apnea)   Elevated troponin   Hypertensive urgency   LOS: 1 day

## 2018-03-29 DIAGNOSIS — N184 Chronic kidney disease, stage 4 (severe): Secondary | ICD-10-CM

## 2018-03-29 DIAGNOSIS — I420 Dilated cardiomyopathy: Secondary | ICD-10-CM

## 2018-03-29 DIAGNOSIS — I5043 Acute on chronic combined systolic (congestive) and diastolic (congestive) heart failure: Secondary | ICD-10-CM

## 2018-03-29 DIAGNOSIS — I161 Hypertensive emergency: Principal | ICD-10-CM

## 2018-03-29 DIAGNOSIS — N170 Acute kidney failure with tubular necrosis: Secondary | ICD-10-CM

## 2018-03-29 LAB — RENAL FUNCTION PANEL
ALBUMIN: 2.5 g/dL — AB (ref 3.5–5.0)
Anion gap: 8 (ref 5–15)
BUN: 29 mg/dL — AB (ref 8–23)
CHLORIDE: 111 mmol/L (ref 98–111)
CO2: 23 mmol/L (ref 22–32)
CREATININE: 4.1 mg/dL — AB (ref 0.61–1.24)
Calcium: 8.4 mg/dL — ABNORMAL LOW (ref 8.9–10.3)
GFR calc Af Amer: 16 mL/min — ABNORMAL LOW (ref >60)
GFR calc non Af Amer: 14 mL/min — ABNORMAL LOW (ref >60)
GLUCOSE: 193 mg/dL — AB (ref 70–99)
POTASSIUM: 3.1 mmol/L — AB (ref 3.5–5.1)
Phosphorus: 3.5 mg/dL (ref 2.5–4.6)
Sodium: 142 mmol/L (ref 135–145)

## 2018-03-29 LAB — GLUCOSE, CAPILLARY
Glucose-Capillary: 163 mg/dL — ABNORMAL HIGH (ref 70–99)
Glucose-Capillary: 230 mg/dL — ABNORMAL HIGH (ref 70–99)
Glucose-Capillary: 125 mg/dL — ABNORMAL HIGH (ref 70–99)
Glucose-Capillary: 165 mg/dL — ABNORMAL HIGH (ref 70–99)

## 2018-03-29 LAB — MAGNESIUM: MAGNESIUM: 2 mg/dL (ref 1.7–2.4)

## 2018-03-29 MED ORDER — POTASSIUM CHLORIDE CRYS ER 20 MEQ PO TBCR
20.0000 meq | EXTENDED_RELEASE_TABLET | Freq: Once | ORAL | Status: AC
  Administered 2018-03-29: 20 meq via ORAL
  Filled 2018-03-29: qty 1

## 2018-03-29 MED ORDER — CARVEDILOL 12.5 MG PO TABS
12.5000 mg | ORAL_TABLET | Freq: Two times a day (BID) | ORAL | Status: DC
  Administered 2018-03-29: 12.5 mg via ORAL
  Filled 2018-03-29: qty 1

## 2018-03-29 NOTE — Consult Note (Signed)
Cardiology Consultation:   Patient ID: Anthony [name]; [id number]; [birth year]   Admit date: 03/27/2018 Date of Consult: 03/29/2018  Primary Care Provider: Secundino Ginger, PA-C Primary Cardiologist: New Primary Electrophysiologist:  N/A   Patient Profile:   Anthony [name] is a 62 y.o. male with a hx of systolic HF,  V9DG, HTN, CKD and OSA who is being seen today for the evaluation of acute on chronic systolic HF at the request of Dr. Verlon Au, Internal Medicine.  History of Present Illness:   Anthony [name] is a 62 y.o. male with a hx of systolic HF, L8VF, HTN, CKD and OSA who is being seen today for the evaluation of acute on chronic systolic HF at the request of Dr. Verlon Au, Internal Medicine. He is new to [employer].   His Cr is in the 4 range and he is followed by outpatient nephrology in [place]. Not yet on HD. Cardiac risk factors include poorly controlled T2DM with Hgb A1c at 11 and HTN.   Per records in [place], he had an echo in 2014 at [facility] which showed reduced EF at 45%. He recalls having a NST at [facility] ~2 years ago and was told that study was a "bit abnormal" however he was never referred for cardiac cath.   [facility]18 with CC of dyspnea. Symptoms present over the last 2 weeks, and gradually worsening. + for orthopnea and PND and exertional dyspnea with moderate activity. No CP. Pt was markedly hypertensive w/ SBP of 240s in the ED. He was started on IV nitro, labetalol and hydralazine. CXR c/w acute CHF and IV lasix also initiated. BNP abnormal at 2,864. Troponins mildly elevated with flat trend, 0.17>>0.14>>.015. Pt denied CP on admit. Scr was 3.93 and has further increased to 4.10. Echo obtained and shows systolic dysfunction with EF lower than prior study in 2014. EF now 35-40%. There is moderate diffuse hypokinesis with distinct regional wall motion abnormalities. Probable disproportionate hypokinesis of the anterolateral and  inferolateral myocardium; consistent with ischemia in the distribution of the left circumflex coronary artery. Doppler parameters are consistent with restrictive physiology, indicative of decreased left ventricular diastolic compliance and/or increased left atrial pressure.  He has diuresed with IV lasix. Net I/Os negative 2.9 L since admit. Dyspnea improved. BP improved compared to ED levels but still uncontrolled. BP this morning was 161/103. He is currently on Bidil 20-37,5, Metoprolol succinate 50 mg daily. No ACE/ARB due to CKD. Also on baby ASA and low dose statin, Lipitor 20 mg. No FLP on file.    Past Medical History:  Diagnosis Date  . CHF (congestive heart failure) ([place])   . Diabetes mellitus without complication ([place])   . Hypertension   . Renal disorder     History reviewed. No pertinent surgical history.   Home Medications:  Prior to Admission medications   Medication Sig Start Date End Date Taking? Authorizing Provider  aspirin 81 MG tablet Take 81 mg by mouth daily.   Yes [provider]  atorvastatin (LIPITOR) 20 MG tablet Take 20 mg by mouth daily.   Yes [provider]  diltiazem (CARDIZEM CD) 240 MG 24 hr capsule Take 240 mg by mouth daily.   Yes [provider]  doxazosin (CARDURA) 4 MG tablet Take 4 mg by mouth daily.   Yes [provider]  fish oil-omega-3 fatty acids 1000 MG capsule Take 1 g by mouth daily.   Yes [provider]  furosemide (  LASIX) 20 MG tablet Take 20 mg by mouth daily.   Yes [provider]  glimepiride (AMARYL) 4 MG tablet Take 4 mg by mouth daily before breakfast.   Yes [provider]  hydrALAZINE (APRESOLINE) 50 MG tablet Take 100 mg by mouth 3 (three) times daily.    Yes [provider]  isosorbide dinitrate (ISORDIL) 20 MG tablet Take 20 mg by mouth daily.   Yes [provider]  labetalol (NORMODYNE) 200 MG tablet Take 200 mg by mouth daily.   Yes [provider]  losartan-hydrochlorothiazide (HYZAAR) 100-25 MG per tablet Take 1 tablet by mouth daily.   Yes [provider]  tamsulosin (FLOMAX) 0.4 MG CAPS capsule Take 0.4 mg by mouth.   Yes [provider]  torsemide (DEMADEX) 20 MG tablet Take 20 mg by mouth daily.   Yes [provider]  Vitamin D, Ergocalciferol, (DRISDOL) 50000 UNITS CAPS capsule Take 50,000 Units by mouth every 7 (seven) days.   Yes [provider]    Inpatient Medications: Scheduled Meds: . aspirin EC  81 mg Oral Daily  . atorvastatin  20 mg Oral Daily  . insulin aspart  0-9 Units Subcutaneous TID WC  . isosorbide-hydrALAZINE  1 tablet Oral TID  . metoprolol succinate  50 mg Oral Daily  . omega-3 acid ethyl esters  1 g Oral Daily  . tamsulosin  0.4 mg Oral QPC supper   Continuous Infusions:  PRN Meds: ondansetron **OR** ondansetron (ZOFRAN) IV  Allergies:   No Known Allergies  Social History:   Social History   Socioeconomic History  . Marital status: Married    Spouse name: Not on file  . Number of children: Not on file  . Years of education: Not on file  . Highest education level: Not on file  Occupational History  . Not on file  Social Needs  . Financial resource strain: Not on file  . Food insecurity:    Worry: Not on file    Inability: Not on file  . Transportation needs:    Medical: Not on file    Non-medical: Not on file  Tobacco Use  . Smoking status: Never Smoker  . Smokeless tobacco: Never Used  Substance and Sexual Activity  . Alcohol use: Yes    Comment: occasional  . Drug use: No  . Sexual activity: Not on file  Lifestyle  . Physical activity:    Days per week: Not on file    Minutes per session: Not on file  . Stress: Not on file  Relationships  . Social connections:    Talks on phone: Not on file    Gets together: Not on file    Attends religious service: Not on file    Active member of club or organization: Not on file     Attends meetings of clubs or organizations: Not on file    Relationship status: Not on file  . Intimate partner violence:    Fear of current or ex partner: Not on file    Emotionally abused: Not on file    Physically abused: Not on file    Forced sexual activity: Not on file  Other Topics Concern  . Not on file  Social History Narrative  . Not on file    Family History:    Family History  Problem Relation Age of Onset  . Congestive Heart Failure Mother   . Diabetes Mellitus II Father   . Hypertension Father  ROS:  Please see the history of present illness.   All other ROS reviewed and negative.     Physical Exam/Data:   Vitals:   03/28/18 2136 03/28/18 2137 03/29/18 0528 03/29/18 0528  BP: (!) 161/111 (!) 161/111  (!) 161/103  Pulse: 66 66  78  Resp:  17  16  Temp: 98.4 F (36.9 C) 98.4 F (36.9 C)  98 F (36.7 C)  TempSrc: Oral Oral  Oral  SpO2: 98% 98%  96%  Weight:   246 lb 14.6 oz (112 kg)   Height:        Intake/Output Summary (Last 24 hours) at 03/29/2018 0844 Last data filed at 03/29/2018 8502 Gross per 24 hour  Intake 540 ml  Output 2350 ml  Net -1810 ml   Filed Weights   03/28/18 0500 03/28/18 1819 03/29/18 0528  Weight: 245 lb 2.4 oz (111.2 kg) 245 lb 13 oz (111.5 kg) 246 lb 14.6 oz (112 kg)   Body mass index is 33.49 kg/m.  General:  Well nourished, well developed, in no acute distress HEENT: normal Lymph: no adenopathy Neck: no JVD Endocrine:  No thryomegaly Vascular: No carotid bruits; FA pulses 2+ bilaterally without bruits  Cardiac:  normal S1, S2; RRR; no murmur  Lungs:  clear to auscultation bilaterally, no wheezing, rhonchi or rales  Abd: soft, nontender, no hepatomegaly  Ext: no edema Musculoskeletal:  No deformities, BUE and BLE strength normal and equal Skin: warm and dry  Neuro:  CNs 2-12 intact, no focal abnormalities noted Psych:  Normal affect   EKG:  The EKG was personally reviewed and demonstrates:  03/27/18, sinus  tach 102 bpm  Telemetry:  Telemetry was personally reviewed and demonstrates:  NSR  Relevant CV Studies: 2D echo 2014 at [place] There are no prior studies for comparison. The left ventricle is mildly dilated. There is mild concentric left ventricular hypertrophy.  Left ventricular systolic function is mildly reduced. LV ejection fraction = 45-50%.  Left ventricular filling pattern is pseudonormal. There is mild global hypokinesis of the left ventricle. The right ventricle is normal in size and function. The left atrium is mildly dilated. The right atrium is mildly dilated. There is trace tricuspid regurgitation. Moderate pulmonary hypertension. There is no pericardial effusion.  2D Echo 03/29/18  Study Conclusions  - Left ventricle: The cavity size was normal. There was mild   concentric hypertrophy. Systolic function was moderately reduced.   The estimated ejection fraction was in the range of 35% to 40%.   Moderate diffuse hypokinesis with distinct regional wall motion   abnormalities. Probable disproportionate hypokinesis of the   anterolateral and inferolateral myocardium; consistent with   ischemia in the distribution of the left circumflex coronary   artery. Doppler parameters are consistent with restrictive   physiology, indicative of decreased left ventricular diastolic   compliance and/or increased left atrial pressure. - Mitral valve: There was mild regurgitation. - Left atrium: The atrium was moderately to severely dilated.  Laboratory Data:  Chemistry Recent Labs  Lab 03/27/18 1606 03/28/18 0532 03/29/18 0534  NA 140 143 142  K 3.3* 3.1* 3.1*  CL 110 113* 111  CO2 20* 23 23  GLUCOSE 345* 224* 193*  BUN 29* 27* 29*  CREATININE 3.93* 3.87* 4.10*  CALCIUM 8.4* 8.2* 8.4*  GFRNONAA 15* 15* 14*  GFRAA 17* 18* 16*  ANIONGAP 10 7 8     Recent Labs  Lab 03/27/18 1606 03/29/18 0534  PROT 6.7  --  ALBUMIN 2.8* 2.5*  AST 27  --     ALT 38  --   ALKPHOS 80  --   BILITOT 0.6  --    Hematology Recent Labs  Lab 03/27/18 1606 03/28/18 0532  WBC 6.0 4.9  RBC 4.60 3.96*  HGB 13.8 11.6*  HCT 41.0 35.6*  MCV 89.1 89.9  MCH 30.0 29.3  MCHC 33.7 32.6  RDW 13.7 13.9  PLT 175 177   Cardiac Enzymes Recent Labs  Lab 03/27/18 1606 03/27/18 2346 03/28/18 0532 03/28/18 1128  TROPONINI 0.17* 0.14* 0.15* 0.13*   No results for input(s): TROPIPOC in the last 168 hours.  BNP Recent Labs  Lab 03/27/18 1606  BNP 2,864.7*    DDimer No results for input(s): DDIMER in the last 168 hours.  Radiology/Studies:  Dg Chest 2 View  Result Date: 03/27/2018 CLINICAL DATA:  Wheezing for 2 weeks EXAM: CHEST - 2 VIEW COMPARISON:  This 09/14/2017 FINDINGS: Mild cardiomegaly is unchanged. Pulmonary arteries remain enlarged. There is central pulmonary vascular congestion without overt edema. No pleural effusion or pneumothorax. No focal consolidation. IMPRESSION: Cardiomegaly with pulmonary vascular congestion, but no overt pulmonary edema. Electronically Signed   By: Ulyses Jarred M.D.   On: 03/27/2018 16:06    Assessment and Plan:   Edenilson Mori is a 62 y.o. male with a hx of systolic HF, T6YB, HTN, CKD and OSA who is being seen today for the evaluation of acute on chronic systolic HF at the request of Dr. Verlon Au, Internal Medicine. He is new to [employer].   1. Acute on Chronic Systolic HF: acute exacerbation in the setting of hypertensive urgency. Good response to Lasix from volume and symptom standpoint, however SCr has increased to now > 4.  BP improved but still uncontrolled. Consider changing BB to Coreg for better BP reduction. No ACE/ARB due to CKD.  May need further increase of Bidil. Continue to monitor volume status. Low salt diet advised.   2. Cardiomyopathy: EF noted to be reduced on echo at outside hospital in 2014, 45% at the time. However updated echo shows further reduction of EF, now at 35-40% and wall motion  abnormalities c/w coronary ischemia. Unfortunately, he has CKD with SCr now in the 4 range. He has apparently has been followed by outpatient nephrology but not yet on HD. He has had exertional dyspnea but no CP, however he may have silent ischemia due to poorly controlled DM. He would benefit from cardiac cath but at high risk for worsening renal function if contrast exposure. Recommend inpatient nephrology consult to help with decision making. Continue BB therapy. Consider changing from metoprolol to Coreg for better BP control. No ACE/ARB due to CKD. Continue Bidil.   3. HTN: poorly controlled. Continue regimen. Additional recs per above. Consider renal artery dopplers to r/o RAS.   4. DM: poorly controlled. Hgb A1c 11. Management per IM.   5. Additional Screening: given other cardiac risk factors, we will check FLP in the AM for baseline assessment of LDL.   6. CKD: SCr now >4. Consider inpatient nephrology consult.   MD to follow with further recommendations.    For questions or updates, please contact [place] Please consult [email or web address] for contact info under Cardiology/STEMI.   Signed, Lyda Jester, PA-C  03/29/2018 8:44 AM

## 2018-03-29 NOTE — Progress Notes (Signed)
Inpatient Diabetes Program Recommendations  AACE/ADA: New Consensus Statement on Inpatient Glycemic Control (2015)  Target Ranges:  Prepandial:   less than 140 mg/dL      Peak postprandial:   less than 180 mg/dL (1-2 hours)      Critically ill patients:  140 - 180 mg/dL   Lab Results  Component Value Date   GLUCAP 230 (H) 03/29/2018   HGBA1C 11.1 (H) 03/27/2018    Discussed insulin with patient and correction coverage. Educated patient on insulin pen use at home as it is similar to his Victoza injection with a couple of differences. Reviewed contents of insulin flexpen starter kit. Reviewed all steps if insulin pen including attachment of needle, 2-unit air shot, dialing up dose, giving injection, removing needle, disposal of sharps, storage of unused insulin, disposal of insulin etc. Patient able to provide successful return demonstration. Also reviewed troubleshooting with insulin pen. Answered patient questions on lifestyle modification since patient is only on correction scale here and we control his diet and activity level. Patient is also not on his Amaryl yet as well. Discussed close follow up with PCP.  Thanks,  Tama Headings RN, MSN, BC-ADM, [facility] Inpatient Diabetes Coordinator Team Pager [phone] (8a-5p)

## 2018-03-29 NOTE — Progress Notes (Addendum)
[facility] PROGRESS NOTE  Anthony [name] JAS:[id number] DOB: Jan 31, 1956 DOA: 03/27/2018 PCP: Secundino Ginger, PA-C   Narrative: 62 aa m-multiple ED visits last 1 09/15/2017--9 at that time for?  Acute CHF Diabetes mellitus type 2 for about 20 years-started seeing a nephrologist in [facility] about 6 months ago because of worsening kidney function-hypertensive but does not take his meds Admitted with hypertensive urgency 7/21  Last had an echo [facility] which showed an EF of 40 to 45% in 2014    A & Plan Hypertensive urgency-evidences of endorgan damage since January 2019- hold his Hyzaar given his AKI and place him on metoprolol XL 50 -changed hydralazine to Bidil--d/c Cardizem Lower very gradually--risk of ATN if too rapid drop in BP  Diabetes mellitus type 2 A1c 11.1-was on Victoza prior to admission unclear if he was taking it-not a candidate for metformin-May benefit from a.m. Amaryl-this is renally cleared so there is a risk of prolonged hypoglycemia-may need endocrinology input-if he is willing we can use low-dose regular insulin and he will need education for the same  Obstructive sleep apnea OHSS habitus-has a CPAP machine but once again is noncompliant-we will place an auto titration overnight  Body mass index is 33.49 kg/m.  Could do with weight loss counseling  hypokalemia-replace x 1 with Kdur 20, MAG wnl 2.0  Type II demand ischemia Systolic HF-?NEW since 4193 -trend in a setting of AECHF likely from CHF-BNP is 2864-he is not having any chest pain-EKG this morning independently reviewed as an non-suggestive of ischemia Cardiology to eval for Moview vs Cath  Cardio-renal syndrome, baseline CKD 3-04--does see [place]  nephro as OP Hyperchloremic contraction alkalosis-better than prior -labs am -will consult renal if further rise in creat and or acidosis/hyperkalemia--think all mediated by cardio-renal, but did order renal RAS studies   Patient is  stabilized to transfer to telemetry  DVT prophylaxis: Lovenox code Status: Full code family Communication: None disposition Plan: Inpatient   Alonni Heimsoth, MD  [facility] Direct contact: [phone] --Via amion app OR  --[email or web address]; password [email or web address]  7PM-7AM contact night coverage as above 03/29/2018, 11:46 AM  LOS: 2 days   Consultants:  None at present  Procedures:  None at present  Antimicrobials:  None  Interval history/Subjective:  Alert in no distress Tolerating diet No chest pain No fever No chills No nausea No vomiting  Objective:  Vitals:  Vitals:   03/28/18 2137 03/29/18 0528  BP: (!) 161/111 (!) 161/103  Pulse: 66 78  Resp: 17 16  Temp: 98.4 F (36.9 C) 98 F (36.7 C)  SpO2: 98% 96%    Exam:  Awake alert no distress NCAT Looks younger than stated age Earring L ear Mallampatti 3 S1-S2-S3 noted as well holosystolic murmur abd obese nt nd no rebound no le edema  I have personally reviewed the following:   Labs:  Potassium 3.1   Creat up from 3.8-->4.1  Sodium 142, chloride 111 above prior baseline  Imaging studies:  Echocardiogram 7/23 Study Conclusions  - Left ventricle: The cavity size was normal. There was mild   concentric hypertrophy. Systolic function was moderately reduced.   The estimated ejection fraction was in the range of 35% to 40%.   Moderate diffuse hypokinesis with distinct regional wall motion   abnormalities. Probable disproportionate hypokinesis of the   anterolateral and inferolateral myocardium; consistent with   ischemia in the distribution of the left circumflex coronary   artery. Doppler parameters are consistent with restrictive  physiology, indicative of decreased left ventricular diastolic   compliance and/or increased left atrial pressure. - Mitral valve: There was mild regurgitation. - Left atrium: The atrium was moderately to severely dilated.  Medical tests:  None  Test discussed with  performing physician:  None  Decision to obtain old records: yes  Review and summation of old records:  no  Scheduled Meds: . aspirin EC  81 mg Oral Daily  . atorvastatin  20 mg Oral Daily  . insulin aspart  0-9 Units Subcutaneous TID WC  . isosorbide-hydrALAZINE  1 tablet Oral TID  . metoprolol succinate  50 mg Oral Daily  . omega-3 acid ethyl esters  1 g Oral Daily  . tamsulosin  0.4 mg Oral QPC supper   Continuous Infusions:  Principal Problem:   Hypertensive emergency Active Problems:   Acute CHF (congestive heart failure) ([facility])   DM (diabetes mellitus), type 2 with renal complications ([facility])   OSA (obstructive sleep apnea)   Elevated troponin   Hypertensive urgency   LOS: 2 days

## 2018-03-30 DIAGNOSIS — R748 Abnormal levels of other serum enzymes: Secondary | ICD-10-CM

## 2018-03-30 DIAGNOSIS — I160 Hypertensive urgency: Secondary | ICD-10-CM

## 2018-03-30 LAB — GLUCOSE, CAPILLARY
Glucose-Capillary: 168 mg/dL — ABNORMAL HIGH (ref 70–99)
Glucose-Capillary: 201 mg/dL — ABNORMAL HIGH (ref 70–99)
Glucose-Capillary: 163 mg/dL — ABNORMAL HIGH (ref 70–99)
Glucose-Capillary: 205 mg/dL — ABNORMAL HIGH (ref 70–99)

## 2018-03-30 LAB — LIPID PANEL
CHOLESTEROL: 207 mg/dL — AB (ref 0–200)
HDL: 25 mg/dL — ABNORMAL LOW (ref >40)
LDL Cholesterol: 152 mg/dL — ABNORMAL HIGH (ref 0–99)
TRIGLYCERIDES: 151 mg/dL — AB (ref <150)
Total CHOL/HDL Ratio: 8.3 RATIO
VLDL: 30 mg/dL (ref 0–40)

## 2018-03-30 MED ORDER — ISOSORB DINITRATE-HYDRALAZINE 20-37.5 MG PO TABS
2.0000 | ORAL_TABLET | Freq: Three times a day (TID) | ORAL | Status: DC
  Administered 2018-03-30 – 2018-03-31 (×6): 2 via ORAL
  Filled 2018-03-30 (×8): qty 2

## 2018-03-30 MED ORDER — CARVEDILOL 25 MG PO TABS
25.0000 mg | ORAL_TABLET | Freq: Two times a day (BID) | ORAL | Status: DC
  Administered 2018-03-30 – 2018-04-03 (×9): 25 mg via ORAL
  Filled 2018-03-30 (×9): qty 1

## 2018-03-30 MED ORDER — HYDRALAZINE HCL 20 MG/ML IJ SOLN
10.0000 mg | Freq: Once | INTRAMUSCULAR | Status: AC
  Administered 2018-03-30: 10 mg via INTRAVENOUS
  Filled 2018-03-30: qty 1

## 2018-03-30 NOTE — Progress Notes (Signed)
Called vascular ultrasound to inquire about pending renal ultrasound. Ultrasound tech stated that order needed to be modified for ultrasound to be performed. Order modified and patient to be NPO after midnight for u/s tomorrow AM. Dr. Maudie Mercury notified.  Anthony [name]. [name], RN

## 2018-03-30 NOTE — Progress Notes (Signed)
Progress Note  Patient Name: Anthony [name] Date of Encounter: 03/30/2018  Primary Cardiologist: New  Subjective   No complaints today. Denies CP. No dyspnea.   Inpatient Medications    Scheduled Meds: . aspirin EC  81 mg Oral Daily  . atorvastatin  20 mg Oral Daily  . carvedilol  25 mg Oral BID WC  . insulin aspart  0-9 Units Subcutaneous TID WC  . isosorbide-hydrALAZINE  2 tablet Oral TID  . omega-3 acid ethyl esters  1 g Oral Daily  . tamsulosin  0.4 mg Oral QPC supper   Continuous Infusions:  PRN Meds: ondansetron **OR** ondansetron (ZOFRAN) IV   Vital Signs    Vitals:   03/30/18 0515 03/30/18 0527 03/30/18 0653 03/30/18 0946  BP: (!) 168/121 (!) 180/110 (!) 183/109 (!) 161/103  Pulse: 78   78  Resp: 18     Temp: 98 F (36.7 C)     TempSrc: Oral     SpO2: 96%     Weight:      Height:        Intake/Output Summary (Last 24 hours) at 03/30/2018 0948 Last data filed at 03/30/2018 0550 Gross per 24 hour  Intake 360 ml  Output 900 ml  Net -540 ml   Filed Weights   03/28/18 1819 03/29/18 0528 03/30/18 0500  Weight: 245 lb 13 oz (111.5 kg) 246 lb 14.6 oz (112 kg) 247 lb 3.2 oz (112.1 kg)    Telemetry    NSR, 70s - Personally Reviewed  ECG    Not performed today - Personally Reviewed  Physical Exam   GEN: No acute distress.   Neck: No JVD Cardiac: RRR, no murmurs, rubs, or gallops.  Respiratory: Clear to auscultation bilaterally. GI: Soft, nontender, non-distended  MS: No edema; No deformity. Neuro:  Nonfocal  Psych: Normal affect   Labs    Chemistry Recent Labs  Lab 03/27/18 1606 03/28/18 0532 03/29/18 0534  NA 140 143 142  K 3.3* 3.1* 3.1*  CL 110 113* 111  CO2 20* 23 23  GLUCOSE 345* 224* 193*  BUN 29* 27* 29*  CREATININE 3.93* 3.87* 4.10*  CALCIUM 8.4* 8.2* 8.4*  PROT 6.7  --   --   ALBUMIN 2.8*  --  2.5*  AST 27  --   --   ALT 38  --   --   ALKPHOS 80  --   --   BILITOT 0.6  --   --   GFRNONAA 15* 15* 14*  GFRAA 17* 18*  16*  ANIONGAP 10 7 8      Hematology Recent Labs  Lab 03/27/18 1606 03/28/18 0532  WBC 6.0 4.9  RBC 4.60 3.96*  HGB 13.8 11.6*  HCT 41.0 35.6*  MCV 89.1 89.9  MCH 30.0 29.3  MCHC 33.7 32.6  RDW 13.7 13.9  PLT 175 177    Cardiac Enzymes Recent Labs  Lab 03/27/18 1606 03/27/18 2346 03/28/18 0532 03/28/18 1128  TROPONINI 0.17* 0.14* 0.15* 0.13*   No results for input(s): TROPIPOC in the last 168 hours.   BNP Recent Labs  Lab 03/27/18 1606  BNP 2,864.7*     DDimer No results for input(s): DDIMER in the last 168 hours.   Radiology    No results found.  Cardiac Studies   2D echo 2014 at [place] There are no prior studies for comparison. The left ventricle is mildly dilated. There is mild concentric left ventricular hypertrophy.  Left ventricular systolic function  is mildly reduced. LV ejection fraction = 45-50%.  Left ventricular filling pattern is pseudonormal. There is mild global hypokinesis of the left ventricle. The right ventricle is normal in size and function. The left atrium is mildly dilated. The right atrium is mildly dilated. There is trace tricuspid regurgitation. Moderate pulmonary hypertension. There is no pericardial effusion.  2D Echo 03/29/18  Study Conclusions  - Left ventricle: The cavity size was normal. There was mild concentric hypertrophy. Systolic function was moderately reduced. The estimated ejection fraction was in the range of 35% to 40%. Moderate diffuse hypokinesis with distinct regional wall motion abnormalities. Probable disproportionate hypokinesis of the anterolateral and inferolateral myocardium; consistent with ischemia in the distribution of the left circumflex coronary artery. Doppler parameters are consistent with restrictive physiology, indicative of decreased left ventricular diastolic compliance and/or increased left atrial pressure. - Mitral valve: There was mild  regurgitation. - Left atrium: The atrium was moderately to severely dilated.   Patient Profile     Anthony [name] is a 62 y.o. male with a hx of systolic HF,  Q4ON, HTN, CKD and OSA who is being seen today for the evaluation of acute on chronic systolic HF at the request of Dr. Verlon Au, Internal Medicine.  Assessment & Plan    1. Acute on Chronic Systolic HF: acute exacerbation in the setting of hypertensive urgency. Good response to Lasix from volume and symptom standpoint, however SCr has increased to now > 4. Net I/Os negative 2.9L. Lasix on hold due to rising SCr. Volume stable. No resting dyspnea.  BP improved but still uncontrolled. BB changed yesterday from metoprolol to Coreg, 25 mg BID for better BP reduction. No ACE/ARB due to CKD.  Bidil also increased to 2 tablets TID. Continue to monitor volume status. Low salt diet advised.   2. Cardiomyopathy: EF noted to be reduced on echo at outside hospital in 2014, 45% at the time. However updated echo shows further reduction of EF, now at 35-40% and wall motion abnormalities c/w coronary ischemia. Unfortunately, he has CKD with SCr now in the 4 range. He has apparently has been followed by outpatient nephrology but not yet on HD. He has had exertional dyspnea but no CP, however he may have silent ischemia due to poorly controlled DM. He would benefit from cardiac cath but at high risk for worsening renal function if contrast exposure. Recommend inpatient nephrology consult to help with decision making. Continue BB therapy. No ACE/ARB due to CKD. Continue Bidil.   3. HTN: poorly controlled. Antihypertensive therapy limited by CKD. Continue on Coreg and Bidil. Renal artery dopplers have been ordered to r/o RAS.   4. DM: poorly controlled. Hgb A1c 11. Management per IM. Currently getting insulin.   5. HLD: FLP today shows elevated LDL at 152 mg/dL. Given multiple cardiac risk factors including poorly controlled DM and HTN, as well as known  systolic HF, ? Underlying CAD and CKD, recommend aggressive lipid lowering therapy. Would recommend increasing Lipitor from 20 mg to 80 mg to target LDL < 70 mg/dL.   6. CKD: followed by outpatient nephrology in [place]. SCr now >4. Repeat Labs including CMP pending to reassess renal function. If SCr has further increased, consider inpatient nephrology consult. Renal dopplers pending.    For questions or updates, please contact [place] Please consult [email or web address] for contact info under Cardiology/STEMI.      Signed, Lyda Jester, PA-C  03/30/2018, 9:48 AM

## 2018-03-30 NOTE — Consult Note (Signed)
Renal Service Consult Note [facility]  Anthony [name] 03/30/2018 Anthony [name] Requesting Physician:  Dr Maudie Mercury  Reason for Consult:  CKD IV HPI: The patient is a 62 y.o. year-old w/ hx of HTN, CKD IV, CHD and DM2 presented to ED on 7/21 w/ SOB and CP.  In ED BP's were very ghigh 240/ 120, she was started on IV NTG drip and given IV lasix and hydralazine and admitted to the hospital.    Cardiology consutled for CHF.  CXR showed vasc congestion. EF by echo is 35- 40% and patient has changes that may be c/w ischemic CM.  Heart cath being discussed but is at risk given CKD IV.  He was given diuretics and had decent response w/ 2700 cc out yest.  We are asked to see for help w/ CKD w possible need for heart cath.   Patient doesn't really know his home BP meds.  There are several listed, but then some of them have the comment "discontinued" besides them in the PTA med list.   He sees a renal doctor in [facility] every few months.  No plans for dialysis have been made.   Date  Creat  notes  oct 2014 2.50  egfr 35  jan 2019 3.90  egfr 18  Mar 27 2018 3.93  jul 22  3.87  jul 23  4.10  egfr 16  ROS  denies CP  no joint pain   no HA  no blurry vision  no rash  no diarrhea  no nausea/ vomiting  no dysuria  no difficulty voiding  no change in urine color    Past Medical History  Past Medical History:  Diagnosis Date  . CHF (congestive heart failure) ([place])   . Diabetes mellitus without complication ([place])   . Hypertension   . Renal disorder    Past Surgical History History reviewed. No pertinent surgical history. Family History  Family History  Problem Relation Age of Onset  . Congestive Heart Failure Mother   . Diabetes Mellitus II Father   . Hypertension Father    Social History  reports that he has never smoked. He has never used smokeless tobacco. He reports that he drinks alcohol. He reports that he does not use drugs. Allergies No Known Allergies Home  medications Prior to Admission medications   Medication Sig Start Date End Date Taking? Authorizing Provider  aspirin 81 MG tablet Take 81 mg by mouth daily.   Yes [provider]  atorvastatin (LIPITOR) 20 MG tablet Take 20 mg by mouth daily.   Yes [provider]  diltiazem (CARDIZEM CD) 240 MG 24 hr capsule Take 240 mg by mouth daily.   Yes [provider]  doxazosin (CARDURA) 4 MG tablet Take 4 mg by mouth daily.   Yes [provider]  fish oil-omega-3 fatty acids 1000 MG capsule Take 1 g by mouth daily.   Yes [provider]  furosemide (LASIX) 20 MG tablet Take 20 mg by mouth daily.   Yes [provider]  glimepiride (AMARYL) 4 MG tablet Take 4 mg by mouth daily before breakfast.   Yes [provider]  hydrALAZINE (APRESOLINE) 50 MG tablet Take 100 mg by mouth 3 (three) times daily.    Yes [provider]  isosorbide dinitrate (ISORDIL) 20 MG tablet Take 20 mg by mouth daily.   Yes [provider]  labetalol (NORMODYNE) 200 MG tablet Take 200 mg by mouth daily.   Yes  [provider]  losartan-hydrochlorothiazide (HYZAAR) 100-25 MG per tablet Take 1 tablet by mouth daily.   Yes [provider]  tamsulosin (FLOMAX) 0.4 MG CAPS capsule Take 0.4 mg by mouth.   Yes [provider]  torsemide (DEMADEX) 20 MG tablet Take 20 mg by mouth daily.   Yes [provider]  Vitamin D, Ergocalciferol, (DRISDOL) 50000 UNITS CAPS capsule Take 50,000 Units by mouth every 7 (seven) days.   Yes [provider]   Liver Function Tests Recent Labs  Lab 03/27/18 1606 03/29/18 0534  AST 27  --   ALT 38  --   ALKPHOS 80  --   BILITOT 0.6  --   PROT 6.7  --   ALBUMIN 2.8* 2.5*   No results for input(s): LIPASE, AMYLASE in the last 168 hours. CBC Recent Labs  Lab 03/27/18 1606 03/28/18 0532  WBC 6.0 4.9  NEUTROABS 4.4  --   HGB 13.8 11.6*  HCT 41.0 35.6*  MCV 89.1 89.9  PLT  175 409   Basic Metabolic Panel Recent Labs  Lab 03/27/18 1606 03/28/18 0532 03/29/18 0534  NA 140 143 142  K 3.3* 3.1* 3.1*  CL 110 113* 111  CO2 20* 23 23  GLUCOSE 345* 224* 193*  BUN 29* 27* 29*  CREATININE 3.93* 3.87* 4.10*  CALCIUM 8.4* 8.2* 8.4*  PHOS  --   --  3.5   Iron/TIBC/Ferritin/ %Sat No results found for: IRON, TIBC, FERRITIN, IRONPCTSAT  Vitals:   03/30/18 0527 03/30/18 0653 03/30/18 0946 03/30/18 1126  BP: (!) 180/110 (!) 183/109 (!) 161/103 (!) 139/98  Pulse:   78 72  Resp:      Temp:      TempSrc:      SpO2:      Weight:      Height:       Exam Gen alert, no distress No rash, cyanosis or gangrene Sclera anicteric, throat clear   No jvd or bruits Chest clear bilat RRR no MRG Abd soft ntnd no mass or ascites +bs GU normal male MS no joint effusions or deformity Ext 1+ pretib edema, no wounds or ulcers Neuro is alert, Ox 3 , nf  CXR 7/21 - vasc congestion, no gross edema   Home meds:  - cardizem cd 240 qd / cardura 4 mg hs/ lasix 20 qd/ hydralazine 100 tid/ labetalol 200 qd/ losartan-hctz 100- 25 qd/ torsemide 20 qd  - ecasa 81/ lipitor 20 qd/ isordil 20 qd  - flomax 0.4 qd  - amaryl 4 mg qam  - prn's / vitamins   Mehran score for post-PCI contrast AKI  - post PCI risk for AKI = 25%  - post PCI risk for dialysis = 1-2%    Impression: 1  CKD stage IV - has about 25% chance of AKI after PCI, and about 1-2% risk of requiring dialysis (according to the post PCI AKI risk calculator).  If heart cath done main suggestion would be to minimize contrast volume as much as possible.  Pt is f/b Dr Dimas Aguas for CKD, in [facility].  Have discussed at length w/ patient.  2  Vol excess - SOB , vasc congestion by CXR, was diuresed some but now diuretic on hold due to creat bump to 4.1 from 3.8.  If not getting heart cath would recommend continue to diurese some more and let creat come up if needed to get excess vol off and assist in BP control.  3  CM  EF  35-40% - possibly ischemic, possible need for heart cath.  4  HTN - on multiple bp meds at home 5  DM 2 - on oral agents   Plan - as above, will sign off. Please call w/ any questions.   Kelly Splinter MD [employer] pager [phone]   03/30/2018, 1:08 PM

## 2018-03-30 NOTE — Progress Notes (Signed)
Patient ID: Anthony [name], male   DOB: September 01, 1956, 62 y.o.   MRN: [id number]                                                                PROGRESS NOTE                                                                                                                                                                                                             Patient Demographics:    Anthony [name], is a 62 y.o. male, DOB - [birth year], YQM:[id number]  Admit date - 03/27/2018   Admitting Physician Jani Gravel, MD  Outpatient Primary MD for the patient is Secundino Ginger, PA-C  LOS - 3  Outpatient Specialists:     Chief Complaint  Patient presents with  . Shortness of Breath       Brief Narrative  62 y.o. male with history of chronic kidney disease likely stage IV patient does not recall his last creatinine follows of blood nephrologist at [facility] no recent blood work in our system with history of diabetes mellitus type 2 hypertension hyperlipidemia sleep apnea presents to the ER at [facility] with complaints of shortness of breath.  Patient has been having exertional shortness of breath and also orthopnea over the last 2 weeks which has been gradually worsening.  Denies any chest pain or productive cough.  Denies any headache or any visual symptoms or focal deficits.  Patient states he may have missed couple of his doses of his antihypertensive last 2 days.  ED Course: In the ER patient blood pressure was more than 528 systolic and diastolic 12/07/3242 20.  Was started on nitroglycerin infusion and since patient had shortness of breath and chest x-ray showing congestion Lasix 20 mg IV was given and also hydralazine 1 dose p.o. was given which she had not taken today.  1 dose of IV labetalol dose was given.  At the time of my exam patient is not in distress denies any chest pain.  Patient admitted for hypertensive urgency and acute congestive heart failure.      Subjective:    Jamaine  Kirkwood today states he is feeling better. Slight dyspnea.  No chest pain.  Denies fever, chills, palp, n/v, diarrhea, dysuria.  Assessment  & Plan :    Principal Problem:   Hypertensive emergency Active Problems:   Acute CHF (congestive heart failure) (HCC)   DM (diabetes mellitus), type 2 with renal complications (HCC)   OSA (obstructive sleep apnea)   Elevated troponin   Hypertensive urgency   Acute on chronic combined systolic and diastolic CHF (congestive heart failure) (HCC)   Acute renal failure with acute tubular necrosis superimposed on stage 4 chronic kidney disease (HCC)   DCM (dilated cardiomyopathy) ([place])   Hypertensive urgency-evidences of endorgan damage since January 2019- Hyzaar held given his Anthony and tx with iv nitro and iv labetalol Converted to Toprol XL and then to Carvedilol (7/23) Increase carvedilol to 25mg  po bid (7/24) Cont hydralazine, increase to 2 po tid as per cardiology recommendations.   Troponin elevation  Appreciate cardiology input  Cardiomyopathy (EF 35-40%) Appreciate cardiology input  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 Nephrology consult this am   Diabetes mellitus type 2 A1c 11.1 Prev was on Victoza prior to admission  unclear if he was taking it Cont fsbs ac and qhs, ISS  Obstructive sleep apnea OHSS habitus-has a CPAP machine but once again is noncompliant-we will place an auto titration overnight  Body mass index is 33.49 kg/m.  Could do with weight loss counseling  Hypokalemia-repleted, MAG wnl 2.0 Check cmp in am   Cardio-renal syndrome, baseline CKD 3-04--does see [place]  nephro as OP Nephrology consult      Code Status :  FULL CODE  Family Communication  : w patient  Disposition Plan  :   home  Barriers For Discharge :   Consults  :   none  Procedures  :     DVT Prophylaxis  :  Lovenox -- SCDs    Lab Results  Component Value Date   PLT 177 03/28/2018    Antibiotics  :  none  Anti-infectives (From admission,  onward)   None        Objective:   Vitals:   03/30/18 0000 03/30/18 0500 03/30/18 0515 03/30/18 0527  BP: (!) 169/109  (!) 168/121 (!) 180/110  Pulse:   78   Resp:   18   Temp:   98 F (36.7 C)   TempSrc:   Oral   SpO2:   96%   Weight:  112.1 kg (247 lb 3.2 oz)    Height:        Wt Readings from Last 3 Encounters:  03/30/18 112.1 kg (247 lb 3.2 oz)  09/14/17 126.6 kg (279 lb)  01/13/15 121.6 kg (268 lb)     Intake/Output Summary (Last 24 hours) at 03/30/2018 0535 Last data filed at 03/29/2018 1700 Gross per 24 hour  Intake 120 ml  Output 500 ml  Net -380 ml     Physical Exam  Awake Alert, Oriented X 3, No new F.N deficits, Normal affect [place].AT,PERRAL Supple Neck,No JVD, No cervical lymphadenopathy appriciated.  Symmetrical Chest wall movement, Good air movement bilaterally, CTAB RRR,No Gallops,Rubs or new Murmurs, No Parasternal Heave +ve B.Sounds, Abd Soft, No tenderness, No organomegaly appriciated, No rebound - guarding or rigidity. No Cyanosis, Clubbing or edema, No new Rash or bruise     Data Review:    CBC Recent Labs  Lab 03/27/18 1606 03/28/18 0532  WBC 6.0 4.9  HGB 13.8 11.6*  HCT 41.0 35.6*  PLT 175 177  MCV 89.1 89.9  MCH 30.0 29.3  MCHC 33.7 32.6  RDW 13.7 13.9  LYMPHSABS 0.9  --  MONOABS 0.6  --   EOSABS 0.1  --   BASOSABS 0.0  --     Chemistries  Recent Labs  Lab 03/27/18 1606 03/28/18 0532 03/29/18 0534  NA 140 143 142  K 3.3* 3.1* 3.1*  CL 110 113* 111  CO2 20* 23 23  GLUCOSE 345* 224* 193*  BUN 29* 27* 29*  CREATININE 3.93* 3.87* 4.10*  CALCIUM 8.4* 8.2* 8.4*  MG  --   --  2.0  AST 27  --   --   ALT 38  --   --   ALKPHOS 80  --   --   BILITOT 0.6  --   --    ------------------------------------------------------------------------------------------------------------------ No results for input(s): CHOL, HDL, LDLCALC, TRIG, CHOLHDL, LDLDIRECT in the last 72 hours.  Lab Results  Component Value Date   HGBA1C 11.1  (H) 03/27/2018   ------------------------------------------------------------------------------------------------------------------ Recent Labs    03/27/18 2346  TSH 0.728   ------------------------------------------------------------------------------------------------------------------ No results for input(s): VITAMINB12, FOLATE, FERRITIN, TIBC, IRON, RETICCTPCT in the last 72 hours.  Coagulation profile No results for input(s): INR, PROTIME in the last 168 hours.  No results for input(s): DDIMER in the last 72 hours.  Cardiac Enzymes Recent Labs  Lab 03/27/18 2346 03/28/18 0532 03/28/18 1128  TROPONINI 0.14* 0.15* 0.13*   ------------------------------------------------------------------------------------------------------------------    Component Value Date/Time   BNP 2,864.7 (H) 03/27/2018 1606    Inpatient Medications  Scheduled Meds: . aspirin EC  81 mg Oral Daily  . atorvastatin  20 mg Oral Daily  . carvedilol  12.5 mg Oral BID WC  . hydrALAZINE  10 mg Intravenous Once  . insulin aspart  0-9 Units Subcutaneous TID WC  . isosorbide-hydrALAZINE  1 tablet Oral TID  . omega-3 acid ethyl esters  1 g Oral Daily  . tamsulosin  0.4 mg Oral QPC supper   Continuous Infusions: PRN Meds:.ondansetron **OR** ondansetron (ZOFRAN) IV  Micro Results Recent Results (from the past 240 hour(s))  MRSA PCR Screening     Status: None   Collection Time: 03/27/18  8:26 PM  Result Value Ref Range Status   MRSA by PCR NEGATIVE NEGATIVE Final    Comment:        The GeneXpert MRSA Assay (FDA approved for NASAL specimens only), is one component of a comprehensive MRSA colonization surveillance program. It is not intended to diagnose MRSA infection nor to guide or monitor treatment for MRSA infections. Performed at [facility], [place] [address] [place], [place] [address]     Radiology Reports Dg Chest 2 View  Result Date: 03/27/2018 CLINICAL DATA:   Wheezing for 2 weeks EXAM: CHEST - 2 VIEW COMPARISON:  This 09/14/2017 FINDINGS: Mild cardiomegaly is unchanged. Pulmonary arteries remain enlarged. There is central pulmonary vascular congestion without overt edema. No pleural effusion or pneumothorax. No focal consolidation. IMPRESSION: Cardiomegaly with pulmonary vascular congestion, but no overt pulmonary edema. Electronically Signed   By: Ulyses Jarred M.D.   On: 03/27/2018 16:06    Time Spent in minutes  30   Jani Gravel M.D on 03/30/2018 at 5:35 AM  Between 7am to 7pm - Pager - [phone]    After 7pm go to [email or web address] - password [facility]  [facility] -  Office  [phone]

## 2018-03-31 ENCOUNTER — Inpatient Hospital Stay (HOSPITAL_COMMUNITY): Payer: 59

## 2018-03-31 DIAGNOSIS — I160 Hypertensive urgency: Secondary | ICD-10-CM

## 2018-03-31 LAB — COMPREHENSIVE METABOLIC PANEL
ALBUMIN: 2.4 g/dL — AB (ref 3.5–5.0)
ALK PHOS: 55 U/L (ref 38–126)
ALT: 21 U/L (ref 0–44)
AST: 12 U/L — AB (ref 15–41)
Anion gap: 5 (ref 5–15)
BUN: 38 mg/dL — AB (ref 8–23)
CALCIUM: 8.3 mg/dL — AB (ref 8.9–10.3)
CO2: 24 mmol/L (ref 22–32)
Chloride: 112 mmol/L — ABNORMAL HIGH (ref 98–111)
Creatinine, Ser: 3.91 mg/dL — ABNORMAL HIGH (ref 0.61–1.24)
GFR calc Af Amer: 17 mL/min — ABNORMAL LOW (ref >60)
GFR calc non Af Amer: 15 mL/min — ABNORMAL LOW (ref >60)
GLUCOSE: 146 mg/dL — AB (ref 70–99)
Potassium: 3.3 mmol/L — ABNORMAL LOW (ref 3.5–5.1)
Sodium: 141 mmol/L (ref 135–145)
TOTAL PROTEIN: 5.1 g/dL — AB (ref 6.5–8.1)
Total Bilirubin: 0.6 mg/dL (ref 0.3–1.2)

## 2018-03-31 LAB — CBC
HEMATOCRIT: 33.8 % — AB (ref 39.0–52.0)
HEMOGLOBIN: 10.9 g/dL — AB (ref 13.0–17.0)
MCH: 29.5 pg (ref 26.0–34.0)
MCHC: 32.2 g/dL (ref 30.0–36.0)
MCV: 91.4 fL (ref 78.0–100.0)
Platelets: 184 10*3/uL (ref 150–400)
RBC: 3.7 MIL/uL — ABNORMAL LOW (ref 4.22–5.81)
RDW: 14 % (ref 11.5–15.5)
WBC: 4.7 10*3/uL (ref 4.0–10.5)

## 2018-03-31 LAB — GLUCOSE, CAPILLARY
Glucose-Capillary: 152 mg/dL — ABNORMAL HIGH (ref 70–99)
Glucose-Capillary: 282 mg/dL — ABNORMAL HIGH (ref 70–99)
Glucose-Capillary: 147 mg/dL — ABNORMAL HIGH (ref 70–99)
Glucose-Capillary: 201 mg/dL — ABNORMAL HIGH (ref 70–99)

## 2018-03-31 MED ORDER — HYDRALAZINE HCL 20 MG/ML IJ SOLN
10.0000 mg | Freq: Four times a day (QID) | INTRAMUSCULAR | Status: DC | PRN
  Administered 2018-03-31 (×2): 10 mg via INTRAVENOUS
  Filled 2018-03-31 (×2): qty 1

## 2018-03-31 MED ORDER — PRO-STAT SUGAR FREE PO LIQD
30.0000 mL | Freq: Two times a day (BID) | ORAL | Status: DC
  Administered 2018-03-31 – 2018-04-03 (×7): 30 mL via ORAL
  Filled 2018-03-31 (×7): qty 30

## 2018-03-31 MED ORDER — HYDRALAZINE HCL 20 MG/ML IJ SOLN
10.0000 mg | INTRAMUSCULAR | Status: DC | PRN
  Administered 2018-04-01: 10 mg via INTRAVENOUS
  Filled 2018-03-31: qty 1

## 2018-03-31 MED ORDER — POTASSIUM CHLORIDE CRYS ER 20 MEQ PO TBCR
20.0000 meq | EXTENDED_RELEASE_TABLET | Freq: Once | ORAL | Status: AC
Start: 2018-03-31 — End: 2018-03-31
  Administered 2018-03-31: 20 meq via ORAL
  Filled 2018-03-31: qty 1

## 2018-03-31 MED ORDER — AMLODIPINE BESYLATE 5 MG PO TABS
5.0000 mg | ORAL_TABLET | Freq: Every day | ORAL | Status: DC
  Administered 2018-03-31: 5 mg via ORAL
  Filled 2018-03-31: qty 1

## 2018-03-31 NOTE — Progress Notes (Signed)
Patient ID: Anthony [name], male   DOB: 10/20/1955, 62 y.o.   MRN: [id number]                                                                PROGRESS NOTE                                                                                                                                                                                                             Patient Demographics:    Anthony [name], is a 62 y.o. male, DOB - Apr 15, 1956, JYN:[id number]  Admit date - 03/27/2018   Admitting Physician Jani Gravel, MD  Outpatient Primary MD for the patient is Lennice Sites Clarisse Gouge  LOS - 4  Outpatient Specialists     Chief Complaint  Patient presents with  . Shortness of Breath       Brief Narrative     62 y.o.malewithhistory of chronic kidney disease likely stage IV patient does not recall his last creatinine follows of blood nephrologist at [facility] no recent blood work in our system with history of diabetes mellitus type 2 hypertension hyperlipidemia sleep apnea presents to the ER at [facility] with complaints of shortness of breath. Patient has been having exertional shortness of breath and also orthopnea over the last 2 weeks which has been gradually worsening. Denies any chest pain or productive cough. Denies any headache or any visual symptoms or focal deficits. Patient states he may have missed couple of his doses of his antihypertensive last 2 days.  ED Course:In the ER patient blood pressure was more than 865 systolic and diastolic 03/15/4695 20. Was started on nitroglycerin infusion and since patient had shortness of breath and chest x-ray showing congestion Lasix 20 mg IV was given and also hydralazine 1 dose p.o. was given which she had not taken today. 1 dose of IV labetalol dose was given. At the time of my exam patient is not in distress denies any chest pain. Patient admitted for hypertensive urgency and acute congestive heart failure.       Subjective:    Anthony [name] today states he is doing well .  Denies headache, cp, palp, sob, n/v,   o new weakness tingling or numbness, No Cough - SOB.   Assessment  & Plan :  Principal Problem:   Hypertensive emergency Active Problems:   Acute CHF (congestive heart failure) (HCC)   DM (diabetes mellitus), type 2 with renal complications (HCC)   OSA (obstructive sleep apnea)   Elevated troponin   Hypertensive urgency   Acute on chronic combined systolic and diastolic CHF (congestive heart failure) (HCC)   Acute renal failure with acute tubular necrosis superimposed on stage 4 chronic kidney disease (HCC)   DCM (dilated cardiomyopathy) ([place])     Hypertensive urgency-evidences of endorgan damage since January 2019- Hyzaar held given his AKI and tx with iv nitro and iv labetalol Converted to Toprol XL and then to Carvedilol (7/23) Increase carvedilol to 25mg  po bid (7/24) Cont hydralazine, increase to 2 po tid as per cardiology  recommendations. (7/24) Start amlodipine.5mg  po qday , hydralazine 10mg  iv q6h prn sbp >160 Defer to cardiology as to whether needs further diuresis  Troponin elevation  Appreciate cardiology input  Cardiomyopathy (EF 35-40%) Appreciate cardiology input  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 Nephrology consult this am   Diabetes mellitus type 2 A1c 11.1 Prev was on Victoza prior to admission  unclear if he was taking it Cont fsbs ac and qhs, ISS  Obstructive sleep apnea OHSS habitus-has a CPAP machine but once again is noncompliant-we will place an auto titration overnight  Body mass index is 33.49 kg/m.Could do with weight loss counseling  Hypokalemia-repleted, MAG wnl 2.0 Check cmp in am   Cardio-renal syndrome, baseline CKD 3-04--does see [facility] nephro as OP Nephrology consultappreciated      Code Status :  FULL CODE  Family Communication  : w patient  Disposition Plan  :   home  Barriers For Discharge :   Consults  :    none  Procedures  :     DVT Prophylaxis  :  Lovenox -- SCDs       Antibiotics  :  none         Anti-infectives (From admission, onward)   None        Objective:   Vitals:   03/30/18 1126 03/30/18 1347 03/30/18 2147 03/31/18 0518  BP: (!) 139/98 (!) 166/106 (!) 176/117 (!) 158/114  Pulse: 72 83 71 77  Resp:  16 18 (!) 22  Temp:  98.3 F (36.8 C) 97.9 F (36.6 C) 98.1 F (36.7 C)  TempSrc:  Oral Oral Oral  SpO2:   100% 100%  Weight:      Height:        Wt Readings from Last 3 Encounters:  03/30/18 112.1 kg (247 lb 3.2 oz)  09/14/17 126.6 kg (279 lb)  01/13/15 121.6 kg (268 lb)     Intake/Output Summary (Last 24 hours) at 03/31/2018 0556 Last data filed at 03/30/2018 2231 Gross per 24 hour  Intake 880 ml  Output -  Net 880 ml     Physical Exam  Awake Alert, Oriented X 3, No new F.N deficits, Normal affect [place].AT,PERRAL Supple Neck,No JVD, No cervical lymphadenopathy appriciated.  Symmetrical Chest wall movement, Good air movement bilaterally, CTAB RRR,No Gallops,Rubs or new Murmurs, No Parasternal Heave +ve B.Sounds, Abd Soft, No tenderness, No organomegaly appriciated, No rebound - guarding or rigidity. No Cyanosis, Clubbing or edema, No new Rash or bruise       Data Review:    CBC Recent Labs  Lab 03/27/18 1606 03/28/18 0532 03/31/18 0513  WBC 6.0 4.9 4.7  HGB 13.8 11.6* 10.9*  HCT 41.0 35.6* 33.8*  PLT 175 177 184  MCV 89.1 89.9  91.4  MCH 30.0 29.3 29.5  MCHC 33.7 32.6 32.2  RDW 13.7 13.9 14.0  LYMPHSABS 0.9  --   --   MONOABS 0.6  --   --   EOSABS 0.1  --   --   BASOSABS 0.0  --   --     Chemistries  Recent Labs  Lab 03/27/18 1606 03/28/18 0532 03/29/18 0534  NA 140 143 142  K 3.3* 3.1* 3.1*  CL 110 113* 111  CO2 20* 23 23  GLUCOSE 345* 224* 193*  BUN 29* 27* 29*  CREATININE 3.93* 3.87* 4.10*  CALCIUM 8.4* 8.2* 8.4*  MG  --   --  2.0  AST 27  --   --   ALT 38  --   --   ALKPHOS 80  --   --   BILITOT 0.6  --    --    ------------------------------------------------------------------------------------------------------------------ Recent Labs    03/30/18 0544  CHOL 207*  HDL 25*  LDLCALC 152*  TRIG 151*  CHOLHDL 8.3    Lab Results  Component Value Date   HGBA1C 11.1 (H) 03/27/2018   ------------------------------------------------------------------------------------------------------------------ No results for input(s): TSH, T4TOTAL, T3FREE, THYROIDAB in the last 72 hours.  Invalid input(s): FREET3 ------------------------------------------------------------------------------------------------------------------ No results for input(s): VITAMINB12, FOLATE, FERRITIN, TIBC, IRON, RETICCTPCT in the last 72 hours.  Coagulation profile No results for input(s): INR, PROTIME in the last 168 hours.  No results for input(s): DDIMER in the last 72 hours.  Cardiac Enzymes Recent Labs  Lab 03/27/18 2346 03/28/18 0532 03/28/18 1128  TROPONINI 0.14* 0.15* 0.13*   ------------------------------------------------------------------------------------------------------------------    Component Value Date/Time   BNP 2,864.7 (H) 03/27/2018 1606    Inpatient Medications  Scheduled Meds: . aspirin EC  81 mg Oral Daily  . atorvastatin  20 mg Oral Daily  . carvedilol  25 mg Oral BID WC  . insulin aspart  0-9 Units Subcutaneous TID WC  . isosorbide-hydrALAZINE  2 tablet Oral TID  . omega-3 acid ethyl esters  1 g Oral Daily  . tamsulosin  0.4 mg Oral QPC supper   Continuous Infusions: PRN Meds:.ondansetron **OR** ondansetron (ZOFRAN) IV  Micro Results Recent Results (from the past 240 hour(s))  MRSA PCR Screening     Status: None   Collection Time: 03/27/18  8:26 PM  Result Value Ref Range Status   MRSA by PCR NEGATIVE NEGATIVE Final    Comment:        The GeneXpert MRSA Assay (FDA approved for NASAL specimens only), is one component of a comprehensive MRSA colonization surveillance  program. It is not intended to diagnose MRSA infection nor to guide or monitor treatment for MRSA infections. Performed at [facility], [place] [address] [place], [place] [address]     Radiology Reports Dg Chest 2 View  Result Date: 03/27/2018 CLINICAL DATA:  Wheezing for 2 weeks EXAM: CHEST - 2 VIEW COMPARISON:  This 09/14/2017 FINDINGS: Mild cardiomegaly is unchanged. Pulmonary arteries remain enlarged. There is central pulmonary vascular congestion without overt edema. No pleural effusion or pneumothorax. No focal consolidation. IMPRESSION: Cardiomegaly with pulmonary vascular congestion, but no overt pulmonary edema. Electronically Signed   By: Ulyses Jarred M.D.   On: 03/27/2018 16:06    Time Spent in minutes  30   Jani Gravel M.D on 03/31/2018 at 5:56 AM  Between 7am to 7pm - Pager - [phone]    After 7pm go to [email or web address] - password [email or web address]  [facility] -  Office  [phone]     

## 2018-03-31 NOTE — Progress Notes (Signed)
Progress Note  Patient Name: Anthony [name] Date of Encounter: 03/31/2018  Primary Cardiologist: New  Subjective   No cardiac complaints. Denies resting dyspnea and no chest pain.   Inpatient Medications    Scheduled Meds: . aspirin EC  81 mg Oral Daily  . atorvastatin  20 mg Oral Daily  . carvedilol  25 mg Oral BID WC  . feeding supplement (PRO-STAT SUGAR FREE 64)  30 mL Oral BID  . insulin aspart  0-9 Units Subcutaneous TID WC  . isosorbide-hydrALAZINE  2 tablet Oral TID  . omega-3 acid ethyl esters  1 g Oral Daily  . potassium chloride  20 mEq Oral Once  . tamsulosin  0.4 mg Oral QPC supper   Continuous Infusions:  PRN Meds: ondansetron **OR** ondansetron (ZOFRAN) IV   Vital Signs    Vitals:   03/30/18 1347 03/30/18 2147 03/31/18 0518 03/31/18 0644  BP: (!) 166/106 (!) 176/117 (!) 158/114   Pulse: 83 71 77   Resp: 16 18 (!) 22   Temp: 98.3 F (36.8 C) 97.9 F (36.6 C) 98.1 F (36.7 C)   TempSrc: Oral Oral Oral   SpO2:  100% 100%   Weight:    246 lb 3.2 oz (111.7 kg)  Height:        Intake/Output Summary (Last 24 hours) at 03/31/2018 0930 Last data filed at 03/31/2018 8841 Gross per 24 hour  Intake 680 ml  Output 350 ml  Net 330 ml   Filed Weights   03/29/18 0528 03/30/18 0500 03/31/18 0644  Weight: 246 lb 14.6 oz (112 kg) 247 lb 3.2 oz (112.1 kg) 246 lb 3.2 oz (111.7 kg)    Telemetry    NSR, 80s - Personally Reviewed  ECG    Not performed today - Personally Reviewed  Physical Exam   GEN: Moderately obese black male in no acute distress.   Neck: No JVD Cardiac: RRR, no murmurs, rubs, or gallops.  Respiratory: Clear to auscultation bilaterally. GI: Soft, nontender, non-distended  MS: No edema; No deformity. Neuro:  Nonfocal  Psych: Normal affect   Labs    Chemistry Recent Labs  Lab 03/27/18 1606 03/28/18 0532 03/29/18 0534 03/31/18 0513  NA 140 143 142 141  K 3.3* 3.1* 3.1* 3.3*  CL 110 113* 111 112*  CO2 20* 23 23 24   GLUCOSE  345* 224* 193* 146*  BUN 29* 27* 29* 38*  CREATININE 3.93* 3.87* 4.10* 3.91*  CALCIUM 8.4* 8.2* 8.4* 8.3*  PROT 6.7  --   --  5.1*  ALBUMIN 2.8*  --  2.5* 2.4*  AST 27  --   --  12*  ALT 38  --   --  21  ALKPHOS 80  --   --  55  BILITOT 0.6  --   --  0.6  GFRNONAA 15* 15* 14* 15*  GFRAA 17* 18* 16* 17*  ANIONGAP 10 7 8 5      Hematology Recent Labs  Lab 03/27/18 1606 03/28/18 0532 03/31/18 0513  WBC 6.0 4.9 4.7  RBC 4.60 3.96* 3.70*  HGB 13.8 11.6* 10.9*  HCT 41.0 35.6* 33.8*  MCV 89.1 89.9 91.4  MCH 30.0 29.3 29.5  MCHC 33.7 32.6 32.2  RDW 13.7 13.9 14.0  PLT 175 177 184    Cardiac Enzymes Recent Labs  Lab 03/27/18 1606 03/27/18 2346 03/28/18 0532 03/28/18 1128  TROPONINI 0.17* 0.14* 0.15* 0.13*   No results for input(s): TROPIPOC in the last 168 hours.   BNP Recent  Labs  Lab 03/27/18 1606  BNP 2,864.7*     DDimer No results for input(s): DDIMER in the last 168 hours.   Radiology    No results found.  Cardiac Studies   2D echo 2014 at [place] There are no prior studies for comparison. The left ventricle is mildly dilated. There is mild concentric left ventricular hypertrophy.  Left ventricular systolic function is mildly reduced. LV ejection fraction = 45-50%.  Left ventricular filling pattern is pseudonormal. There is mild global hypokinesis of the left ventricle. The right ventricle is normal in size and function. The left atrium is mildly dilated. The right atrium is mildly dilated. There is trace tricuspid regurgitation. Moderate pulmonary hypertension. There is no pericardial effusion.  2D Echo 03/29/18  Study Conclusions  - Left ventricle: The cavity size was normal. There was mild concentric hypertrophy. Systolic function was moderately reduced. The estimated ejection fraction was in the range of 35% to 40%. Moderate diffuse hypokinesis with distinct regional wall motion abnormalities. Probable  disproportionate hypokinesis of the anterolateral and inferolateral myocardium; consistent with ischemia in the distribution of the left circumflex coronary artery. Doppler parameters are consistent with restrictive physiology, indicative of decreased left ventricular diastolic compliance and/or increased left atrial pressure. - Mitral valve: There was mild regurgitation. - Left atrium: The atrium was moderately to severely dilated.    Patient Profile     Anthony Bilesis a 62 y.o.malewith a hx of systolic HF, E1DE, HTN, CKD and OSAwho is being seen today for the evaluation of acute on chronic systolic HFat the request of Dr. Verlon Au, Internal Medicine.  Assessment & Plan    1. Acute on Chronic Systolic HF: acute exacerbation in the setting of hypertensive urgency.Good response to Lasix from volume and symptom standpoint, however SCr increased to >4 w/ diuresis. Lasix held. SCr slightly improving, down from 4.10>>3.91. Volume appears stable. No dyspnea. Continue medical therapy with Coreg and Bidil. No ACE/ARB due to CKD. Continue to monitor volume status. Low salt diet advised.   2. Cardiomyopathy: EF noted to be reduced on echo at outside hospital in 2014, 45% at the time. However updated echo shows further reduction of EF, now at 35-40% and wall motion abnormalities c/w coronary ischemia. Unfortunately, he has CKD with SCr now in the 4 range. He has apparentlyhas been followed byoutpatient nephrology but not yet on HD. He has had exertional dyspnea but no CP, however he may have silent ischemia due to poorly controlled DM. He would benefit from cardiac cath. Nephrology has seen pt. Per nephrology, Pt has about 25% chance of AKI after PCI, and about 1-2% risk of requiring dialysis (according to the post PCI AKI risk calculator).  If heart cath done, main suggestion would be to minimize contrast volume as much as possible (from Dr. Barkley Bruns Consult note 03/30/18). Dr. Radford Pax to  discuss risk and benefit of cath and pt will have to reach a decision. For now, continue medical therapy. Continue BB therapy. No ACE/ARB due to CKD. Continue Bidil.   3. YCX:KGYJEH controlled. Antihypertensive therapy limited by CKD. Continue on Coreg and Bidil. Renal artery dopplers have been completed to check for RAS. Report pending.   4. DM: poorly controlled. Hgb A1c 11. Management per IM. Currently getting insulin.   5. HLD: FLP today shows elevated LDL at 152 mg/dL. Given multiple cardiac risk factors including poorly controlled DM and HTN, as well as known systolic HF, ? Underlying CAD and CKD, recommend aggressive lipid lowering therapy.  Would recommend increasing Lipitor from 20 mg to 80 mg to target LDL < 70 mg/dL.   6. CKD: followed by outpatient nephrology in [place]. SCr increased >4 this admit, but trending doward. Renal dopplers pending. We appreciate nephrology's assistant with risk stratification/ recommendations regarding possible cardiac cath.     For questions or updates, please contact [place] Please consult [email or web address] for contact info under Cardiology/STEMI.      Signed, Lyda Jester, PA-C  03/31/2018, 9:30 AM

## 2018-03-31 NOTE — Progress Notes (Signed)
Inpatient Diabetes Program Recommendations  AACE/ADA: New Consensus Statement on Inpatient Glycemic Control (2015)  Target Ranges:  Prepandial:   less than 140 mg/dL      Peak postprandial:   less than 180 mg/dL (1-2 hours)      Critically ill patients:  140 - 180 mg/dL   Results for [name], Anthony (MRN [id number]) as of 03/31/2018 07:24  Ref. Range 03/30/2018 07:48 03/30/2018 11:28 03/30/2018 17:02 03/30/2018 21:44  Glucose-Capillary Latest Ref Range: 70 - 99 mg/dL 168 (H)  2 units NOVOLOG  201 (H)  3 units NOVOLOG  163 (H)  2 units NOVOLOG  205 (H)    Home DM Meds: Amaryl 4 mg daily  Current Insulin Orders: Novolog Sensitive Correction Scale/ SSI (0-9 units) TID AC      Patient eating 100% of meals.    Having postprandial glucose elevations.     MD- Note patient may be NPO this AM for renal ultrasound.  After procedure, please consider starting Novolog Meal Coverage for this patient:  Novolog 3 units TID with meals (Please add the following Hold Parameters: Hold if pt eats <50% of meal, Hold if pt NPO)     --Will follow patient during hospitalization--  Wyn Quaker RN, MSN, CDE Diabetes Coordinator Inpatient Glycemic Control Team Team Pager: [phone] (8a-5p)

## 2018-03-31 NOTE — Progress Notes (Signed)
Preliminary results by tech - Renal Duplex Completed. No evidence of a significant stenosis in the main renal artery bilaterally.  Oda Cogan, BS, RDMS, RVT

## 2018-04-01 ENCOUNTER — Ambulatory Visit (HOSPITAL_COMMUNITY)
Admit: 2018-04-01 | Discharge: 2018-04-01 | Disposition: A | Discharge status: Home / Self Care | Payer: 59 | Source: Ambulatory Visit | Attending: Cardiology | Admitting: Cardiology

## 2018-04-01 DIAGNOSIS — R079 Chest pain, unspecified: Secondary | ICD-10-CM

## 2018-04-01 DIAGNOSIS — R9439 Abnormal result of other cardiovascular function study: Secondary | ICD-10-CM | POA: Insufficient documentation

## 2018-04-01 DIAGNOSIS — I429 Cardiomyopathy, unspecified: Secondary | ICD-10-CM | POA: Insufficient documentation

## 2018-04-01 DIAGNOSIS — N189 Chronic kidney disease, unspecified: Secondary | ICD-10-CM

## 2018-04-01 LAB — URINALYSIS, ROUTINE W REFLEX MICROSCOPIC
Bilirubin Urine: NEGATIVE
Glucose, UA: >=500 mg/dL — AB
Hgb urine dipstick: NEGATIVE
Ketones, ur: NEGATIVE mg/dL
Leukocytes, UA: NEGATIVE
Nitrite: NEGATIVE
Protein, ur: >=300 mg/dL — AB
SPECIFIC GRAVITY, URINE: 1.012 (ref 1.005–1.030)
pH: 5 (ref 5.0–8.0)

## 2018-04-01 LAB — CBC
HCT: 33.7 % — ABNORMAL LOW (ref 39.0–52.0)
Hemoglobin: 11 g/dL — ABNORMAL LOW (ref 13.0–17.0)
MCH: 29.9 pg (ref 26.0–34.0)
MCHC: 32.6 g/dL (ref 30.0–36.0)
MCV: 91.6 fL (ref 78.0–100.0)
PLATELETS: 199 10*3/uL (ref 150–400)
RBC: 3.68 MIL/uL — AB (ref 4.22–5.81)
RDW: 14 % (ref 11.5–15.5)
WBC: 5.3 10*3/uL (ref 4.0–10.5)

## 2018-04-01 LAB — COMPREHENSIVE METABOLIC PANEL
ALT: 19 U/L (ref 0–44)
ANION GAP: 6 (ref 5–15)
AST: 13 U/L — ABNORMAL LOW (ref 15–41)
Albumin: 2.5 g/dL — ABNORMAL LOW (ref 3.5–5.0)
Alkaline Phosphatase: 53 U/L (ref 38–126)
BUN: 44 mg/dL — ABNORMAL HIGH (ref 8–23)
CHLORIDE: 113 mmol/L — AB (ref 98–111)
CO2: 24 mmol/L (ref 22–32)
CREATININE: 3.95 mg/dL — AB (ref 0.61–1.24)
Calcium: 8.6 mg/dL — ABNORMAL LOW (ref 8.9–10.3)
GFR calc Af Amer: 17 mL/min — ABNORMAL LOW (ref >60)
GFR calc non Af Amer: 15 mL/min — ABNORMAL LOW (ref >60)
Glucose, Bld: 150 mg/dL — ABNORMAL HIGH (ref 70–99)
Potassium: 3.6 mmol/L (ref 3.5–5.1)
SODIUM: 143 mmol/L (ref 135–145)
Total Bilirubin: 0.5 mg/dL (ref 0.3–1.2)
Total Protein: 5.3 g/dL — ABNORMAL LOW (ref 6.5–8.1)

## 2018-04-01 LAB — NM MYOCAR MULTI W/SPECT W/WALL MOTION / EF
CSEPPHR: 94 {beats}/min
Rest HR: 94 {beats}/min

## 2018-04-01 LAB — GLUCOSE, CAPILLARY
GLUCOSE-CAPILLARY: 156 mg/dL — AB (ref 70–99)
GLUCOSE-CAPILLARY: 168 mg/dL — AB (ref 70–99)
GLUCOSE-CAPILLARY: 190 mg/dL — AB (ref 70–99)
Glucose-Capillary: 300 mg/dL — ABNORMAL HIGH (ref 70–99)

## 2018-04-01 MED ORDER — AMLODIPINE BESYLATE 10 MG PO TABS
10.0000 mg | ORAL_TABLET | Freq: Every day | ORAL | Status: DC
  Administered 2018-04-01 – 2018-04-03 (×3): 10 mg via ORAL
  Filled 2018-04-01 (×5): qty 1

## 2018-04-01 MED ORDER — HYDRALAZINE HCL 50 MG PO TABS: 100.0000 mg | ORAL_TABLET | Freq: Three times a day (TID) | ORAL | Status: DC

## 2018-04-01 MED ORDER — TECHNETIUM TC 99M TETROFOSMIN IV KIT
30.0000 | PACK | Freq: Once | INTRAVENOUS | Status: AC
  Administered 2018-04-01: 30 via INTRAVENOUS

## 2018-04-01 MED ORDER — ATORVASTATIN CALCIUM 40 MG PO TABS
80.0000 mg | ORAL_TABLET | Freq: Every day | ORAL | Status: DC
  Administered 2018-04-01 – 2018-04-03 (×3): 80 mg via ORAL
  Filled 2018-04-01 (×3): qty 2

## 2018-04-01 MED ORDER — ISOSORBIDE MONONITRATE ER 60 MG PO TB24
60.0000 mg | ORAL_TABLET | Freq: Every day | ORAL | Status: DC
  Administered 2018-04-01: 60 mg via ORAL
  Filled 2018-04-01 (×2): qty 1

## 2018-04-01 MED ORDER — REGADENOSON 0.4 MG/5ML IV SOLN
INTRAVENOUS | Status: AC
  Administered 2018-04-01: 0.4 mg via INTRAVENOUS
  Filled 2018-04-01: qty 5

## 2018-04-01 MED ORDER — HEPARIN SODIUM (PORCINE) 5000 UNIT/ML IJ SOLN
5000.0000 [IU] | Freq: Three times a day (TID) | INTRAMUSCULAR | Status: DC
  Administered 2018-04-01 – 2018-04-03 (×6): 5000 [IU] via SUBCUTANEOUS
  Filled 2018-04-01 (×6): qty 1

## 2018-04-01 MED ORDER — TECHNETIUM TC 99M TETROFOSMIN IV KIT
10.0000 | PACK | Freq: Once | INTRAVENOUS | Status: AC | PRN
  Administered 2018-04-01: 10 via INTRAVENOUS

## 2018-04-01 MED ORDER — MINOXIDIL 2.5 MG PO TABS
2.5000 mg | ORAL_TABLET | Freq: Every day | ORAL | Status: DC
  Administered 2018-04-01 – 2018-04-03 (×3): 2.5 mg via ORAL
  Filled 2018-04-01 (×3): qty 1

## 2018-04-01 MED ORDER — HYDRALAZINE HCL 50 MG PO TABS
100.0000 mg | ORAL_TABLET | Freq: Three times a day (TID) | ORAL | Status: DC
  Administered 2018-04-01 – 2018-04-03 (×7): 100 mg via ORAL
  Filled 2018-04-01 (×7): qty 2

## 2018-04-01 MED ORDER — REGADENOSON 0.4 MG/5ML IV SOLN
0.4000 mg | Freq: Once | INTRAVENOUS | Status: AC
  Administered 2018-04-01: 0.4 mg via INTRAVENOUS

## 2018-04-01 NOTE — Progress Notes (Signed)
Scheduled morning blood pressure medications: Norvasc, hydralazine, and coreg adminstered per verbal order from Melina Copa, PA.

## 2018-04-01 NOTE — Progress Notes (Signed)
Patient ID: Anthony [name], male   DOB: 12-Nov-1955, 62 y.o.   MRN: [id number]                                                                PROGRESS NOTE                                                                                                                                                                                                             Patient Demographics:    Anthony [name], is a 62 y.o. male, DOB - 12-Oct-1955, BDZ:[id number]  Admit date - 03/27/2018   Admitting Physician Jani Gravel, MD  Outpatient Primary MD for the patient is Secundino Ginger, PA-C  LOS - 5  Outpatient Specialists:    Chief Complaint  Patient presents with  . Shortness of Breath       Brief Narrative   62 y.o.malewithhistory of chronic kidney disease likely stage IV patient does not recall his last creatinine follows of blood nephrologist at [facility] no recent blood work in our system with history of diabetes mellitus type 2 hypertension hyperlipidemia sleep apnea presents to the ER at [facility] with complaints of shortness of breath. Patient has been having exertional shortness of breath and also orthopnea over the last 2 weeks which has been gradually worsening. Denies any chest pain or productive cough. Denies any headache or any visual symptoms or focal deficits. Patient states he may have missed couple of his doses of his antihypertensive last 2 days.  ED Course:In the ER patient blood pressure was more than 341 systolic and diastolic 05/13/2228 20. Was started on nitroglycerin infusion and since patient had shortness of breath and chest x-ray showing congestion Lasix 20 mg IV was given and also hydralazine 1 dose p.o. was given which she had not taken today. 1 dose of IV labetalol dose was given. At the time of my exam patient is not in distress denies any chest pain. Patient admitted for hypertensive urgency and acute congestive heart failure.      Subjective:    Anthony [name] today still has high bp.  Pt is doing well.   Awaiting nuclear stress testing  No headache, No chest pain, No abdominal pain - No Nausea, No new weakness tingling or numbness, No Cough -  SOB.    Assessment  & Plan :    Principal Problem:   Hypertensive emergency Active Problems:   Acute CHF (congestive heart failure) (HCC)   DM (diabetes mellitus), type 2 with renal complications (HCC)   OSA (obstructive sleep apnea)   Elevated troponin   Hypertensive urgency   Acute on chronic combined systolic and diastolic CHF (congestive heart failure) (HCC)   Acute renal failure with acute tubular necrosis superimposed on stage 4 chronic kidney disease (HCC)   DCM (dilated cardiomyopathy) ([place])      Hypertensive urgency-evidences of endorgan damage since January 2019- Hyzaarheldgiven his AKI and tx with iv nitro and iv labetalol Renal artery ultrasound 7/25->negative for stenosis Converted to Toprol XL and then to Carvedilol (7/23) Increase carvedilol to 25mg  po bid (7/24) Cont hydralazine, increase to 2 po tid as per cardiology  recommendations. (7/24) Start amlodipine.5mg  po qday (7/25) Increase amlodipine to 10mg  po qday (7/26) Increase hydralazine 10mg  iv q4h prn sbp >160 Will consider spironolactone if bp remains uncontrolled Defer to cardiology as to whether needs further diuresis  Troponin elevation  Appreciate cardiology input  Cardiomyopathy (EF 35-40%) Appreciate cardiology input  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 Nephrology consult this am  Diabetes mellitus type 2 A1c 11.1 Prevwas on Victoza prior to admission  unclear if he was taking it Cont fsbs ac and qhs, ISS  Obstructive sleep apnea OHSS habitus-has a CPAP machine but once again is noncompliant-we will place an auto titration overnight  Body mass index is 33.49 kg/m.Could do with weight loss counseling  Hypokalemia-repleted, MAG wnl 2.0 Check cmp in am  Cardio-renal syndrome, baseline CKD 3-04--does see  [facility] neph[facility] as OP Nephrology consultappreciated      Code Status:FULL CODE  Family Communication :w patient  Disposition Plan:home  Barriers For Discharge:  Consults :none  Procedures :   DVT Prophylaxis: Lovenox-- SCDs     Antibiotics :none         Anti-infectives (From admission, onward)   None        Objective:   Vitals:   03/31/18 2134 03/31/18 2343 04/01/18 0507 04/01/18 0706  BP: (!) 177/112 (!) 157/106 (!) 175/116 (!) 171/112  Pulse: 77 77 86 83  Resp: 18  18   Temp: 98.3 F (36.8 C)  98.4 F (36.9 C)   TempSrc: Oral     SpO2: 99%  99%   Weight:   112.6 kg (248 lb 4.8 oz)   Height:        Wt Readings from Last 3 Encounters:  04/01/18 112.6 kg (248 lb 4.8 oz)  09/14/17 126.6 kg (279 lb)  01/13/15 121.6 kg (268 lb)     Intake/Output Summary (Last 24 hours) at 04/01/2018 0711 Last data filed at 04/01/2018 0639 Gross per 24 hour  Intake 240 ml  Output 1275 ml  Net -1035 ml     Physical Exam  Awake Alert, Oriented X 3, No new F.N deficits, Normal affect Edom.AT,PERRAL Supple Neck,No JVD, No cervical lymphadenopathy appriciated.  Symmetrical Chest wall movement, Good air movement bilaterally, CTAB RRR,No Gallops,Rubs or new Murmurs, No Parasternal Heave +ve B.Sounds, Abd Soft, No tenderness, No organomegaly appriciated, No rebound - guarding or rigidity. No Cyanosis, Clubbing or edema, No new Rash or bruise     Data Review:    CBC Recent Labs  Lab 03/27/18 1606 03/28/18 0532 03/31/18 0513 04/01/18 0454  WBC 6.0 4.9 4.7 5.3  HGB 13.8 11.6* 10.9* 11.0*  HCT 41.0 35.6* 33.8* 33.7*  PLT 175  177 184 199  MCV 89.1 89.9 91.4 91.6  MCH 30.0 29.3 29.5 29.9  MCHC 33.7 32.6 32.2 32.6  RDW 13.7 13.9 14.0 14.0  LYMPHSABS 0.9  --   --   --   MONOABS 0.6  --   --   --   EOSABS 0.1  --   --   --   BASOSABS 0.0  --   --   --     Chemistries  Recent Labs  Lab 03/27/18 1606  03/28/18 0532 03/29/18 0534 03/31/18 0513 04/01/18 0454  NA 140 143 142 141 143  K 3.3* 3.1* 3.1* 3.3* 3.6  CL 110 113* 111 112* 113*  CO2 20* 23 23 24 24   GLUCOSE 345* 224* 193* 146* 150*  BUN 29* 27* 29* 38* 44*  CREATININE 3.93* 3.87* 4.10* 3.91* 3.95*  CALCIUM 8.4* 8.2* 8.4* 8.3* 8.6*  MG  --   --  2.0  --   --   AST 27  --   --  12* 13*  ALT 38  --   --  21 19  ALKPHOS 80  --   --  55 53  BILITOT 0.6  --   --  0.6 0.5   ------------------------------------------------------------------------------------------------------------------ Recent Labs    03/30/18 0544  CHOL 207*  HDL 25*  LDLCALC 152*  TRIG 151*  CHOLHDL 8.3    Lab Results  Component Value Date   HGBA1C 11.1 (H) 03/27/2018   ------------------------------------------------------------------------------------------------------------------ No results for input(s): TSH, T4TOTAL, T3FREE, THYROIDAB in the last 72 hours.  Invalid input(s): FREET3 ------------------------------------------------------------------------------------------------------------------ No results for input(s): VITAMINB12, FOLATE, FERRITIN, TIBC, IRON, RETICCTPCT in the last 72 hours.  Coagulation profile No results for input(s): INR, PROTIME in the last 168 hours.  No results for input(s): DDIMER in the last 72 hours.  Cardiac Enzymes Recent Labs  Lab 03/27/18 2346 03/28/18 0532 03/28/18 1128  TROPONINI 0.14* 0.15* 0.13*   ------------------------------------------------------------------------------------------------------------------    Component Value Date/Time   BNP 2,864.7 (H) 03/27/2018 1606    Inpatient Medications  Scheduled Meds: . amLODipine  10 mg Oral Daily  . aspirin EC  81 mg Oral Daily  . atorvastatin  20 mg Oral Daily  . carvedilol  25 mg Oral BID WC  . feeding supplement (PRO-STAT SUGAR FREE 64)  30 mL Oral BID  . heparin injection (subcutaneous)  5,000 Units Subcutaneous Q8H  . insulin aspart  0-9  Units Subcutaneous TID WC  . isosorbide-hydrALAZINE  2 tablet Oral TID  . omega-3 acid ethyl esters  1 g Oral Daily  . tamsulosin  0.4 mg Oral QPC supper   Continuous Infusions: PRN Meds:.hydrALAZINE, ondansetron **OR** ondansetron (ZOFRAN) IV  Micro Results Recent Results (from the past 240 hour(s))  MRSA PCR Screening     Status: None   Collection Time: 03/27/18  8:26 PM  Result Value Ref Range Status   MRSA by PCR NEGATIVE NEGATIVE Final    Comment:        The GeneXpert MRSA Assay (FDA approved for NASAL specimens only), is one component of a comprehensive MRSA colonization surveillance program. It is not intended to diagnose MRSA infection nor to guide or monitor treatment for MRSA infections. Performed at [facility], [place] [address], [place], [place] [address]     Radiology Reports Dg Chest 2 View  Result Date: 03/27/2018 CLINICAL DATA:  Wheezing for 2 weeks EXAM: CHEST - 2 VIEW COMPARISON:  This 09/14/2017 FINDINGS: Mild cardiomegaly is unchanged. Pulmonary arteries remain  enlarged. There is central pulmonary vascular congestion without overt edema. No pleural effusion or pneumothorax. No focal consolidation. IMPRESSION: Cardiomegaly with pulmonary vascular congestion, but no overt pulmonary edema. Electronically Signed   By: Ulyses Jarred M.D.   On: 03/27/2018 16:06    Time Spent in minutes  30   Jani Gravel M.D on 04/01/2018 at 7:11 AM  Between 7am to 7pm - Pager - [phone]    After 7pm go to [email or web address] - password [facility]  [facility] -  Office  [phone]

## 2018-04-01 NOTE — Progress Notes (Addendum)
Progress Note  Patient Name: Anthony [name] Date of Encounter: 04/01/2018  Primary Cardiologist: Fransico Him, MD  Subjective   Pt seen in nuc med with BP 192/115 initially. Pt does recall being told he had protein in urine a while back. No CP, SOB, orthopnea.  Inpatient Medications    Scheduled Meds: . amLODipine  10 mg Oral Daily  . aspirin EC  81 mg Oral Daily  . atorvastatin  80 mg Oral Daily  . carvedilol  25 mg Oral BID WC  . feeding supplement (PRO-STAT SUGAR FREE 64)  30 mL Oral BID  . heparin injection (subcutaneous)  5,000 Units Subcutaneous Q8H  . hydrALAZINE  100 mg Oral Q8H  . insulin aspart  0-9 Units Subcutaneous TID WC  . isosorbide mononitrate  60 mg Oral Daily  . omega-3 acid ethyl esters  1 g Oral Daily  . tamsulosin  0.4 mg Oral QPC supper   Continuous Infusions:  PRN Meds: hydrALAZINE, ondansetron **OR** ondansetron (ZOFRAN) IV   Vital Signs    Vitals:   03/31/18 2134 03/31/18 2343 04/01/18 0507 04/01/18 0706  BP: (!) 177/112 (!) 157/106 (!) 175/116 (!) 171/112  Pulse: 77 77 86 83  Resp: 18  18   Temp: 98.3 F (36.8 C)  98.4 F (36.9 C)   TempSrc: Oral     SpO2: 99%  99%   Weight:   248 lb 4.8 oz (112.6 kg)   Height:        Intake/Output Summary (Last 24 hours) at 04/01/2018 0941 Last data filed at 04/01/2018 0700 Gross per 24 hour  Intake 240 ml  Output 1275 ml  Net -1035 ml   Filed Weights   03/30/18 0500 03/31/18 0644 04/01/18 0507  Weight: 247 lb 3.2 oz (112.1 kg) 246 lb 3.2 oz (111.7 kg) 248 lb 4.8 oz (112.6 kg)    Telemetry    Unable to review @ [place]  Physical Exam   GEN: No acute distress.  HEENT: Normocephalic, atraumatic, sclera non-icteric. Neck: No JVD or bruits. Cardiac: RRR no murmurs, rubs, or gallops.  Radials/DP/PT 1+ and equal bilaterally.  Respiratory: Clear to auscultation bilaterally. Breathing is unlabored. GI: Soft, nontender, non-distended, BS +x 4. MS: no deformity. Extremities: No clubbing or  cyanosis. Trace lower extremity edema. Distal pedal pulses are 2+ and equal bilaterally. Neuro:  AAOx3. Follows commands. Psych:  Responds to questions appropriately with a normal affect.  Labs    Chemistry Recent Labs  Lab 03/27/18 1606  03/29/18 0534 03/31/18 0513 04/01/18 0454  NA 140   < > 142 141 143  K 3.3*   < > 3.1* 3.3* 3.6  CL 110   < > 111 112* 113*  CO2 20*   < > 23 24 24   GLUCOSE 345*   < > 193* 146* 150*  BUN 29*   < > 29* 38* 44*  CREATININE 3.93*   < > 4.10* 3.91* 3.95*  CALCIUM 8.4*   < > 8.4* 8.3* 8.6*  PROT 6.7  --   --  5.1* 5.3*  ALBUMIN 2.8*  --  2.5* 2.4* 2.5*  AST 27  --   --  12* 13*  ALT 38  --   --  21 19  ALKPHOS 80  --   --  55 53  BILITOT 0.6  --   --  0.6 0.5  GFRNONAA 15*   < > 14* 15* 15*  GFRAA 17*   < > 16* 17* 17*  ANIONGAP 10   < > 8 5 6    < > = values in this interval not displayed.     Hematology Recent Labs  Lab 03/28/18 0532 03/31/18 0513 04/01/18 0454  WBC 4.9 4.7 5.3  RBC 3.96* 3.70* 3.68*  HGB 11.6* 10.9* 11.0*  HCT 35.6* 33.8* 33.7*  MCV 89.9 91.4 91.6  MCH 29.3 29.5 29.9  MCHC 32.6 32.2 32.6  RDW 13.9 14.0 14.0  PLT 177 184 199    Cardiac Enzymes Recent Labs  Lab 03/27/18 1606 03/27/18 2346 03/28/18 0532 03/28/18 1128  TROPONINI 0.17* 0.14* 0.15* 0.13*   No results for input(s): TROPIPOC in the last 168 hours.   BNP Recent Labs  Lab 03/27/18 1606  BNP 2,864.7*     DDimer No results for input(s): DDIMER in the last 168 hours.   Patient Profile     62 y.o. male with chronic systolic CHF (EF 35% at [facility] 2014), T2DM, HTN, CKD and OSA who was admitted with dyspnea, worsening renal function, mildly elevated troponin, and decrease in EF.Received IV Lasix this admission with Cr bump from 3.8->4.1. Nephrology seen to assess possible need for cath, indicated he'd have about 25% chance of AKI after PCI, and about 1-2% risk of requiring dialysis (according to the post PCI AKI risk calculator). Nuclear stress test  planned for ischemic assessment. Renal recommends that if not getting heart cath would recommend continue to diurese some more and let creat come up if needed to get excess vol off and assist in BP control.   Assessment & Plan    1. Acute on Chronic Systolic HF with worsening cardiomyopathy: further diuretic on hold given AKI and possible need for cath. No prior cath. HTN remains poorly controlled. D/w Dr. Radford Pax. Will d/c Bidil (as he is now getting less hydralazine than home dose), and convert to Imdur 60mg  daily and hydralazine 100mg  TID. Amlodipine was just increased today by prior provider - less ideal med with LV dysfunction but options for BP control are extrmely limited in light of renal dysfunction. Continue carvedilol 25mg  BID. Our team will follow up nuc med results in AM.  2. DHR:CBULA artery duplex negative. Albumin 2.5, LDL is up, Cr is up. I am suspicious of nephrotic process. TSH wnl. Will order UA and 24-hour urine protein. If abnormal will need to re-involve renal for further recs regarding his ACEI/ARB which were stopped this admission (losartan at home). Would also consider OP sleep study if not pursued in the past. See above re: med changes.  3. DM: poorly controlled. Hgb A1c 11. Management per internal medicine.  4. HLD: LDL 152. Given multiple cardiac risk factors including poorly controlled DM and HTN, as well as known systolic HF, aggressive lipid lowering is indicated. Per previous note will enact plan to titrate atorvastatin to 80mg . If the patient is tolerating statin at time of follow-up appointment, would consider rechecking liver function/lipid panel in 6-8 weeks.  5. CKD stage GT:XMIWOEHO by outpatient nephrology in [place].SCr increased >4 this admit, but trending downward. Patient will need close OP f/u of this.  For questions or updates, please contact [place] Please consult [email or web address] for contact info under Cardiology/STEMI.  Signed, Charlie Pitter,  PA-C 04/01/2018, 9:41 AM

## 2018-04-01 NOTE — Progress Notes (Signed)
Inpatient Diabetes Program Recommendations  AACE/ADA: New Consensus Statement on Inpatient Glycemic Control (2015)  Target Ranges:  Prepandial:   less than 140 mg/dL      Peak postprandial:   less than 180 mg/dL (1-2 hours)      Critically ill patients:  140 - 180 mg/dL   Results for Maciolek, Noa (MRN [id number]) as of 04/01/2018 10:52  Ref. Range 03/31/2018 07:46 03/31/2018 11:37 03/31/2018 16:49 03/31/2018 21:36  Glucose-Capillary Latest Ref Range: 70 - 99 mg/dL 152 (H) 282 (H) 147 (H) 201 (H)    Home DM Meds: Amaryl 4 mg daily  Current Insulin Orders: Novolog Sensitive Correction Scale/ SSI (0-9 units) TID AC     Patient eating 100% of meals.    Having postprandial glucose elevations.     MD- Note patient NPO this AM for nuclear stress test.  After procedure, please consider starting Novolog Meal Coverage for this patient:  Novolog 3 units TID with meals (Please add the following Hold Parameters: Hold if pt eats <50% of meal, Hold if pt NPO)     --Will follow patient during hospitalization--  Wyn Quaker RN, MSN, CDE Diabetes Coordinator Inpatient Glycemic Control Team Team Pager: [phone] (8a-5p)

## 2018-04-01 NOTE — Progress Notes (Signed)
   Anthony [name] presented for a nuclear stress test today.  No immediate complications.  Stress imaging is pending at this time.  Preliminary EKG findings may be listed in the chart, but the stress test result will not be finalized until perfusion imaging is complete.  Charlie Pitter, PA-C 04/01/2018, 10:01 AM

## 2018-04-02 LAB — CBC
HCT: 36.4 % — ABNORMAL LOW (ref 39.0–52.0)
Hemoglobin: 11.5 g/dL — ABNORMAL LOW (ref 13.0–17.0)
MCH: 29.2 pg (ref 26.0–34.0)
MCHC: 31.6 g/dL (ref 30.0–36.0)
MCV: 92.4 fL (ref 78.0–100.0)
Platelets: 204 10*3/uL (ref 150–400)
RBC: 3.94 MIL/uL — AB (ref 4.22–5.81)
RDW: 14.1 % (ref 11.5–15.5)
WBC: 4.2 10*3/uL (ref 4.0–10.5)

## 2018-04-02 LAB — COMPREHENSIVE METABOLIC PANEL
ALBUMIN: 2.7 g/dL — AB (ref 3.5–5.0)
ALK PHOS: 59 U/L (ref 38–126)
ALT: 18 U/L (ref 0–44)
ANION GAP: 7 (ref 5–15)
AST: 15 U/L (ref 15–41)
BUN: 44 mg/dL — ABNORMAL HIGH (ref 8–23)
CALCIUM: 8.6 mg/dL — AB (ref 8.9–10.3)
CO2: 23 mmol/L (ref 22–32)
Chloride: 110 mmol/L (ref 98–111)
Creatinine, Ser: 3.93 mg/dL — ABNORMAL HIGH (ref 0.61–1.24)
GFR calc non Af Amer: 15 mL/min — ABNORMAL LOW (ref >60)
GFR, EST AFRICAN AMERICAN: 17 mL/min — AB (ref >60)
Glucose, Bld: 155 mg/dL — ABNORMAL HIGH (ref 70–99)
Potassium: 3.5 mmol/L (ref 3.5–5.1)
SODIUM: 140 mmol/L (ref 135–145)
Total Bilirubin: 0.7 mg/dL (ref 0.3–1.2)
Total Protein: 5.7 g/dL — ABNORMAL LOW (ref 6.5–8.1)

## 2018-04-02 LAB — PROTEIN, URINE, 24 HOUR
Collection Interval-UPROT: 24 hours
PROTEIN, 24H URINE: 1168 mg/d — AB (ref 50–100)
PROTEIN, URINE: 73 mg/dL
Urine Total Volume-UPROT: 1600 mL

## 2018-04-02 LAB — GLUCOSE, CAPILLARY
Glucose-Capillary: 150 mg/dL — ABNORMAL HIGH (ref 70–99)
Glucose-Capillary: 228 mg/dL — ABNORMAL HIGH (ref 70–99)
Glucose-Capillary: 181 mg/dL — ABNORMAL HIGH (ref 70–99)
Glucose-Capillary: 219 mg/dL — ABNORMAL HIGH (ref 70–99)

## 2018-04-02 MED ORDER — FUROSEMIDE 20 MG PO TABS
20.0000 mg | ORAL_TABLET | Freq: Every day | ORAL | Status: DC
  Administered 2018-04-02 – 2018-04-03 (×2): 20 mg via ORAL
  Filled 2018-04-02: qty 1

## 2018-04-02 MED ORDER — ISOSORBIDE MONONITRATE ER 60 MG PO TB24
90.0000 mg | ORAL_TABLET | Freq: Every day | ORAL | Status: DC
  Administered 2018-04-02 – 2018-04-03 (×2): 90 mg via ORAL
  Filled 2018-04-02 (×2): qty 1

## 2018-04-02 NOTE — Progress Notes (Signed)
Patient ID: Anthony [name], male   DOB: 1956/04/29, 62 y.o.   MRN: [id number]                                                                PROGRESS NOTE                                                                                                                                                                                                             Patient Demographics:    Anthony [name], is a 62 y.o. male, DOB - Jan 17, 1956, OIN:[id number]  Admit date - 03/27/2018   Admitting Physician Jani Gravel, MD  Outpatient Primary MD for the patient is Secundino Ginger, PA-C  LOS - 6  Outpatient Specialists:     Chief Complaint  Patient presents with  . Shortness of Breath       Brief Narrative   62 y.o.malewithhistory of chronic kidney disease likely stage IV patient does not recall his last creatinine follows of blood nephrologist at [facility] no recent blood work in our system with history of diabetes mellitus type 2 hypertension hyperlipidemia sleep apnea presents to the ER at [facility] with complaints of shortness of breath. Patient has been having exertional shortness of breath and also orthopnea over the last 2 weeks which has been gradually worsening. Denies any chest pain or productive cough. Denies any headache or any visual symptoms or focal deficits. Patient states he may have missed couple of his doses of his antihypertensive last 2 days.  ED Course:In the ER patient blood pressure was more than 709 systolic and diastolic 02/06/8365 20. Was started on nitroglycerin infusion and since patient had shortness of breath and chest x-ray showing congestion Lasix 20 mg IV was given and also hydralazine 1 dose p.o. was given which she had not taken today. 1 dose of IV labetalol dose was given. At the time of my exam patient is not in distress denies any chest pain. Patient admitted for hypertensive urgency and acute congestive heart failure.      Subjective:    Anthony [name] today has improved bp.  Nuclear stress test negative for ischemia.   No headache, No chest pain, No abdominal pain - No Nausea, No new weakness tingling or numbness, No Cough - SOB.  Assessment  & Plan :    Principal Problem:   Hypertensive emergency Active Problems:   Acute CHF (congestive heart failure) (HCC)   DM (diabetes mellitus), type 2 with renal complications (HCC)   OSA (obstructive sleep apnea)   Elevated troponin   Hypertensive urgency   Acute on chronic combined systolic and diastolic CHF (congestive heart failure) (HCC)   Acute renal failure with acute tubular necrosis superimposed on stage 4 chronic kidney disease (HCC)   DCM (dilated cardiomyopathy) (HCC)   Chronic kidney disease    Hypertensive urgency-evidences of endorgan damage since January 2019- Hyzaarheldgiven his AKI and tx with iv nitro and iv labetalol Renal artery ultrasound 7/25->negative for stenosis Converted to Toprol XL and then to Carvedilol (7/23) Increase carvedilol to 25mg  po bid (7/24) Cont hydralazine, increase to 2 po tid as per cardiology recommendations. (7/24) Start amlodipine.5mg  po qday(7/25) Increase amlodipine to 10mg  po qday (7/26) Increase hydralazine 10mg  iv q4h prn sbp >160 Start minoxidil 2.5mg  po qday (7/26) Bilil=> hydralazine 100mg  po tid and Imdur 60mg  po qday (7/26) Start lasix 20mg  po qday Will consider spironolactone if bp remains uncontrolled Defer to cardiology as to whether needs further diuresis  Troponin elevation  Appreciate cardiology input  Cardiomyopathy (EF 35-40%) Appreciate cardiology input  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 Nephrology consult this am  Diabetes mellitus type 2 A1c 11.1 Prevwas on Victoza prior to admission  unclear if he was taking it Cont fsbs ac and qhs, ISS  Obstructive sleep apnea OHSS habitus-has a CPAP machine but once again is noncompliant-we will place an auto titration overnight  Body mass index is 33.49 kg/m.Could do  with weight loss counseling  Hypokalemia-repleted, MAG wnl 2.0 Check cmp in am  Cardio-renal syndrome, baseline CKD 3-04--does see [place] nephro as OP Nephrology consultappreciated         Code Status : Full code  Family Communication  : w patient  Disposition Plan  :  home  Barriers For Discharge :   Consults  :  Cardiology, nephrology  Procedures  : nuclear stress test 7/26=> negative for ischemia, EF 37%  DVT Prophylaxis  :  Heparin - SCDs   Lab Results  Component Value Date   PLT 204 04/02/2018    Antibiotics  :  none  Anti-infectives (From admission, onward)   None        Objective:   Vitals:   04/01/18 0706 04/01/18 1311 04/01/18 2034 04/02/18 0437  BP: (!) 171/112 (!) 168/105 (!) 151/99 (!) 137/91  Pulse: 83 76 78 80  Resp:  18 18 18   Temp:  98.2 F (36.8 C) 98.2 F (36.8 C) 98.2 F (36.8 C)  TempSrc:  Oral Oral   SpO2:  98% 98% 99%  Weight:    112.6 kg (248 lb 3.2 oz)  Height:        Wt Readings from Last 3 Encounters:  04/02/18 112.6 kg (248 lb 3.2 oz)  09/14/17 126.6 kg (279 lb)  01/13/15 121.6 kg (268 lb)     Intake/Output Summary (Last 24 hours) at 04/02/2018 0952 Last data filed at 04/02/2018 0457 Gross per 24 hour  Intake 740 ml  Output 800 ml  Net -60 ml     Physical Exam  Awake Alert, Oriented X 3, No new F.N deficits, Normal affect [place].AT,PERRAL Supple Neck,No JVD, No cervical lymphadenopathy appriciated.  Symmetrical Chest wall movement, Good air movement bilaterally, CTAB RRR,No Gallops,Rubs or new Murmurs, No Parasternal Heave +ve B.Sounds, Abd Soft, No tenderness, No organomegaly  appriciated, No rebound - guarding or rigidity. No Cyanosis, Clubbing or edema, No new Rash or bruise      Data Review:    CBC Recent Labs  Lab 03/27/18 1606 03/28/18 0532 03/31/18 0513 04/01/18 0454 04/02/18 0458  WBC 6.0 4.9 4.7 5.3 4.2  HGB 13.8 11.6* 10.9* 11.0* 11.5*  HCT 41.0 35.6* 33.8* 33.7* 36.4*  PLT 175  177 184 199 204  MCV 89.1 89.9 91.4 91.6 92.4  MCH 30.0 29.3 29.5 29.9 29.2  MCHC 33.7 32.6 32.2 32.6 31.6  RDW 13.7 13.9 14.0 14.0 14.1  LYMPHSABS 0.9  --   --   --   --   MONOABS 0.6  --   --   --   --   EOSABS 0.1  --   --   --   --   BASOSABS 0.0  --   --   --   --     Chemistries  Recent Labs  Lab 03/27/18 1606 03/28/18 0532 03/29/18 0534 03/31/18 0513 04/01/18 0454 04/02/18 0458  NA 140 143 142 141 143 140  K 3.3* 3.1* 3.1* 3.3* 3.6 3.5  CL 110 113* 111 112* 113* 110  CO2 20* 23 23 24 24 23   GLUCOSE 345* 224* 193* 146* 150* 155*  BUN 29* 27* 29* 38* 44* 44*  CREATININE 3.93* 3.87* 4.10* 3.91* 3.95* 3.93*  CALCIUM 8.4* 8.2* 8.4* 8.3* 8.6* 8.6*  MG  --   --  2.0  --   --   --   AST 27  --   --  12* 13* 15  ALT 38  --   --  21 19 18   ALKPHOS 80  --   --  55 53 59  BILITOT 0.6  --   --  0.6 0.5 0.7   ------------------------------------------------------------------------------------------------------------------ No results for input(s): CHOL, HDL, LDLCALC, TRIG, CHOLHDL, LDLDIRECT in the last 72 hours.  Lab Results  Component Value Date   HGBA1C 11.1 (H) 03/27/2018   ------------------------------------------------------------------------------------------------------------------ No results for input(s): TSH, T4TOTAL, T3FREE, THYROIDAB in the last 72 hours.  Invalid input(s): FREET3 ------------------------------------------------------------------------------------------------------------------ No results for input(s): VITAMINB12, FOLATE, FERRITIN, TIBC, IRON, RETICCTPCT in the last 72 hours.  Coagulation profile No results for input(s): INR, PROTIME in the last 168 hours.  No results for input(s): DDIMER in the last 72 hours.  Cardiac Enzymes Recent Labs  Lab 03/27/18 2346 03/28/18 0532 03/28/18 1128  TROPONINI 0.14* 0.15* 0.13*   ------------------------------------------------------------------------------------------------------------------      Component Value Date/Time   BNP 2,864.7 (H) 03/27/2018 1606    Inpatient Medications  Scheduled Meds: . amLODipine  10 mg Oral Daily  . aspirin EC  81 mg Oral Daily  . atorvastatin  80 mg Oral Daily  . carvedilol  25 mg Oral BID WC  . feeding supplement (PRO-STAT SUGAR FREE 64)  30 mL Oral BID  . heparin injection (subcutaneous)  5,000 Units Subcutaneous Q8H  . hydrALAZINE  100 mg Oral Q8H  . insulin aspart  0-9 Units Subcutaneous TID WC  . isosorbide mononitrate  90 mg Oral Daily  . minoxidil  2.5 mg Oral Daily  . omega-3 acid ethyl esters  1 g Oral Daily  . tamsulosin  0.4 mg Oral QPC supper   Continuous Infusions: PRN Meds:.hydrALAZINE, ondansetron **OR** ondansetron (ZOFRAN) IV  Micro Results Recent Results (from the past 240 hour(s))  MRSA PCR Screening     Status: None   Collection Time: 03/27/18  8:26 PM  Result Value Ref  Range Status   MRSA by PCR NEGATIVE NEGATIVE Final    Comment:        The GeneXpert MRSA Assay (FDA approved for NASAL specimens only), is one component of a comprehensive MRSA colonization surveillance program. It is not intended to diagnose MRSA infection nor to guide or monitor treatment for MRSA infections. Performed at [facility], [place] [address] [place], [place] [address]     Radiology Reports Dg Chest 2 View  Result Date: 03/27/2018 CLINICAL DATA:  Wheezing for 2 weeks EXAM: CHEST - 2 VIEW COMPARISON:  This 09/14/2017 FINDINGS: Mild cardiomegaly is unchanged. Pulmonary arteries remain enlarged. There is central pulmonary vascular congestion without overt edema. No pleural effusion or pneumothorax. No focal consolidation. IMPRESSION: Cardiomegaly with pulmonary vascular congestion, but no overt pulmonary edema. Electronically Signed   By: Ulyses Jarred M.D.   On: 03/27/2018 16:06   Nm Myocar Multi W/spect W/wall Motion / Ef  Result Date: 04/01/2018  There was no ST segment deviation noted during stress.  No T  wave inversion was noted during stress.  Defect 1: There is a medium defect of moderate severity.  This is an intermediate risk study.  Nuclear stress EF: 37%.  Moderate size and intensity fixed inferoapical and apical perfusion defect, likely artifact or possibly scar. No reversible ischemia (SDS 0). LVEF 37% with moderate LV cavity dilatation and severe global hypokinesis. This is an intermediate risk study.    Time Spent in minutes  30   Jani Gravel M.D on 04/02/2018 at 9:52 AM  Between 7am to 7pm - Pager - [phone]  After 7pm go to [email or web address] - password [facility]  [facility] -  Office  [phone]

## 2018-04-02 NOTE — Progress Notes (Signed)
I have sent a message to our office's scheduling team requesting a follow-up appointment, and our office will call the patient with this information.  Hillary Struss PA-C  

## 2018-04-02 NOTE — Progress Notes (Addendum)
Progress Note  Patient Name: Anthony [name] Date of Encounter: 04/02/2018  Primary Cardiologist: Fransico Him, MD   Subjective   Denies any chest pain or SOB  Inpatient Medications    Scheduled Meds: . amLODipine  10 mg Oral Daily  . aspirin EC  81 mg Oral Daily  . atorvastatin  80 mg Oral Daily  . carvedilol  25 mg Oral BID WC  . feeding supplement (PRO-STAT SUGAR FREE 64)  30 mL Oral BID  . heparin injection (subcutaneous)  5,000 Units Subcutaneous Q8H  . hydrALAZINE  100 mg Oral Q8H  . insulin aspart  0-9 Units Subcutaneous TID WC  . isosorbide mononitrate  60 mg Oral Daily  . minoxidil  2.5 mg Oral Daily  . omega-3 acid ethyl esters  1 g Oral Daily  . tamsulosin  0.4 mg Oral QPC supper   Continuous Infusions:  PRN Meds: hydrALAZINE, ondansetron **OR** ondansetron (ZOFRAN) IV   Vital Signs    Vitals:   04/01/18 0706 04/01/18 1311 04/01/18 2034 04/02/18 0437  BP: (!) 171/112 (!) 168/105 (!) 151/99 (!) 137/91  Pulse: 83 76 78 80  Resp:  18 18 18   Temp:  98.2 F (36.8 C) 98.2 F (36.8 C) 98.2 F (36.8 C)  TempSrc:  Oral Oral   SpO2:  98% 98% 99%  Weight:    248 lb 3.2 oz (112.6 kg)  Height:        Intake/Output Summary (Last 24 hours) at 04/02/2018 0737 Last data filed at 04/02/2018 0457 Gross per 24 hour  Intake 740 ml  Output 800 ml  Net -60 ml   Filed Weights   03/31/18 0644 04/01/18 0507 04/02/18 0437  Weight: 246 lb 3.2 oz (111.7 kg) 248 lb 4.8 oz (112.6 kg) 248 lb 3.2 oz (112.6 kg)    Telemetry    NSR - Personally Reviewed  ECG    No new EKG to review - Personally Reviewed  Physical Exam   GEN: No acute distress.   Neck: No JVD Cardiac: RRR, no murmurs, rubs, or gallops.  Respiratory: Clear to auscultation bilaterally. GI: Soft, nontender, non-distended  MS: No edema; No deformity. Neuro:  Nonfocal  Psych: Normal affect   Labs    Chemistry Recent Labs  Lab 03/31/18 0513 04/01/18 0454 04/02/18 0458  NA 141 143 140  K 3.3*  3.6 3.5  CL 112* 113* 110  CO2 24 24 23   GLUCOSE 146* 150* 155*  BUN 38* 44* 44*  CREATININE 3.91* 3.95* 3.93*  CALCIUM 8.3* 8.6* 8.6*  PROT 5.1* 5.3* 5.7*  ALBUMIN 2.4* 2.5* 2.7*  AST 12* 13* 15  ALT 21 19 18   ALKPHOS 55 53 59  BILITOT 0.6 0.5 0.7  GFRNONAA 15* 15* 15*  GFRAA 17* 17* 17*  ANIONGAP 5 6 7      Hematology Recent Labs  Lab 03/31/18 0513 04/01/18 0454 04/02/18 0458  WBC 4.7 5.3 4.2  RBC 3.70* 3.68* 3.94*  HGB 10.9* 11.0* 11.5*  HCT 33.8* 33.7* 36.4*  MCV 91.4 91.6 92.4  MCH 29.5 29.9 29.2  MCHC 32.2 32.6 31.6  RDW 14.0 14.0 14.1  PLT 184 199 204    Cardiac Enzymes Recent Labs  Lab 03/27/18 1606 03/27/18 2346 03/28/18 0532 03/28/18 1128  TROPONINI 0.17* 0.14* 0.15* 0.13*   No results for input(s): TROPIPOC in the last 168 hours.   BNP Recent Labs  Lab 03/27/18 1606  BNP 2,864.7*     DDimer No results for input(s): DDIMER in the  last 168 hours.   Radiology    Nm Myocar Multi W/spect W/wall Motion / Ef  Result Date: 04/01/2018  There was no ST segment deviation noted during stress.  No T wave inversion was noted during stress.  Defect 1: There is a medium defect of moderate severity.  This is an intermediate risk study.  Nuclear stress EF: 37%.  Moderate size and intensity fixed inferoapical and apical perfusion defect, likely artifact or possibly scar. No reversible ischemia (SDS 0). LVEF 37% with moderate LV cavity dilatation and severe global hypokinesis. This is an intermediate risk study.    Cardiac Studies   2D Echo 03/29/18  Study Conclusions  - Left ventricle: The cavity size was normal. There was mild concentric hypertrophy. Systolic function was moderately reduced. The estimated ejection fraction was in the range of 35% to 40%. Moderate diffuse hypokinesis with distinct regional wall motion abnormalities. Probable disproportionate hypokinesis of the anterolateral and inferolateral myocardium; consistent  with ischemia in the distribution of the left circumflex coronary artery. Doppler parameters are consistent with restrictive physiology, indicative of decreased left ventricular diastolic compliance and/or increased left atrial pressure. - Mitral valve: There was mild regurgitation. - Left atrium: The atrium was moderately to severely dilated.  Nuclear stress test 04/01/2018  There was no ST segment deviation noted during stress.  No T wave inversion was noted during stress.  Defect 1: There is a medium defect of moderate severity.  This is an intermediate risk study.  Nuclear stress EF: 37%.   Moderate size and intensity fixed inferoapical and apical perfusion defect, likely artifact or possibly scar. No reversible ischemia (SDS 0). LVEF 37% with moderate LV cavity dilatation and severe global hypokinesis. This is an intermediate risk study.  Patient Profile     62 y.o. male with a hx of systolic HF, L0BE, HTN, CKD and OSAwho is being seen today for the evaluation of acute on chronic systolic HFat the request of Dr. Verlon Au, Internal Medicine.  Assessment & Plan    1. Acute on Chronic Systolic HF:  -acute exacerbation in the setting of hypertensive urgency. -Good response to Lasix from volume and symptom standpoint, however SCr increased to >4 w/ diuresis.  -Lasix currently on hold by nephrology.  -SCr minimally improved, down from 4.10>>3.91.  -He put out 800cc yesterday and is net neg 3.5L but weight has remained unchanged from admission -Volume appears stable on exam. No dyspnea.  -Continue medical therapy with Core, Hydralazine and Imdur -No ACE/ARB due to CKD.  -Low salt diet advised.  -further dosing of diuretics per renal  2. Cardiomyopathy:  -EF noted to be reduced on echo at outside hospital in 2014, 45% at the time.  -EF this admission shows further reduction of EF, now at 35-40% with wall motion abnormalities. -Unfortunately, he has CKD with SCr  now in the 4 range.  -He has had exertional dyspnea which can be explained by CHF but no CP.  -nuclear stress test showed fixed inferoapical and apical scar vs attenuation with no ischemia and EF 37% - considered intermediate study due to LV dysfunction.  This is likely attenuation artifact as echo showed normal wall motion in apex and inferior apical region and wall motion abnormality was anterolateral and inferolateral walls which had normal perfusion on nuc. -given significant renal disease with Creatinine 3.91 and 25% risk of AKI after PCI and 1-2% risk of need for HD and the fact that he has not had CP, recommend continuing medical therapy.  -Continue  BB therapy.  -No ACE/ARB due to CKD.  -Bidil changed to Hydralazine and Imdur to allow increase dosing that Bidil does not provide as BP was markedly increased yesterday.  -continue ASA 81mg  daily, long acting nitrate, high dose statin and BB.  3. HTN: -remains elevated at 151/6mmHg.  -Antihypertensive therapy limited by CKD. -Continue on Coreg 25mg  BID and amlodipine 10mg  daily -Bidil changed to Hydralazine 100mg  TID and Imdur 60mg  daily yesterday to get better BP control -increase Imdur to 90mg  daily today -Renal artery dopplersnegative for RAS -24 hour UA in progress  4. DM: poorly controlled. Hgb A1c 11. Management per IM.Currently getting insulin.  5. HLD:  -FLP today shows elevated LDL at 152 mg/dL.  -Given multiple cardiac risk factors including poorly controlled DM and HTN, as well as known systolic HF, ? Underlying CAD and CKD, recommend aggressive lipid lowering therapy.  -Lipitor increased from 20 mg to 80 mg to target LDL < 70 mg/dL. -he will need FLP and ALT in 6 weeks  6. LXB:WIOMBTDH by outpatient nephrology in [place]. -SCr increased >4 this admit, but trending downward slowly -Renal dopplers with no RAS -diuretic dosing per nephrology  [facility] will sign off.   Medication Recommendations:  Carvedilol  25mg  BID, Hydralazine 100mg  TID, Imdur 90mg  daily, amlodipine 10mg  daily, ASA 81mg  daily, Lipitor 80mg  daily.  Diuretic therapy to be managed by nephrology Other recommendations (labs, testing, etc):  none Follow up as an outpatient:  followup with me or extender in 1-2 weeks     For questions or updates, please contact [place] Please consult [email or web address] for contact info under Cardiology/STEMI.      Signed, Fransico Him, MD  04/02/2018, 7:37 AM

## 2018-04-03 LAB — CBC
HEMATOCRIT: 33.9 % — AB (ref 39.0–52.0)
Hemoglobin: 10.9 g/dL — ABNORMAL LOW (ref 13.0–17.0)
MCH: 29.5 pg (ref 26.0–34.0)
MCHC: 32.2 g/dL (ref 30.0–36.0)
MCV: 91.6 fL (ref 78.0–100.0)
Platelets: 183 10*3/uL (ref 150–400)
RBC: 3.7 MIL/uL — ABNORMAL LOW (ref 4.22–5.81)
RDW: 13.8 % (ref 11.5–15.5)
WBC: 4 10*3/uL (ref 4.0–10.5)

## 2018-04-03 LAB — COMPREHENSIVE METABOLIC PANEL
ALT: 16 U/L (ref 0–44)
ANION GAP: 6 (ref 5–15)
AST: 14 U/L — ABNORMAL LOW (ref 15–41)
Albumin: 2.5 g/dL — ABNORMAL LOW (ref 3.5–5.0)
Alkaline Phosphatase: 55 U/L (ref 38–126)
BILIRUBIN TOTAL: 0.4 mg/dL (ref 0.3–1.2)
BUN: 50 mg/dL — ABNORMAL HIGH (ref 8–23)
CALCIUM: 8.4 mg/dL — AB (ref 8.9–10.3)
CO2: 23 mmol/L (ref 22–32)
CREATININE: 4.07 mg/dL — AB (ref 0.61–1.24)
Chloride: 110 mmol/L (ref 98–111)
GFR calc Af Amer: 17 mL/min — ABNORMAL LOW (ref >60)
GFR calc non Af Amer: 14 mL/min — ABNORMAL LOW (ref >60)
GLUCOSE: 157 mg/dL — AB (ref 70–99)
POTASSIUM: 3.5 mmol/L (ref 3.5–5.1)
Sodium: 139 mmol/L (ref 135–145)
Total Protein: 5.4 g/dL — ABNORMAL LOW (ref 6.5–8.1)

## 2018-04-03 LAB — GLUCOSE, CAPILLARY: GLUCOSE-CAPILLARY: 152 mg/dL — AB (ref 70–99)

## 2018-04-03 MED ORDER — PRO-STAT SUGAR FREE PO LIQD: 30.0000 mL | Freq: Two times a day (BID) | ORAL | 0 refills | Status: DC

## 2018-04-03 MED ORDER — MINOXIDIL 2.5 MG PO TABS
5.0000 mg | ORAL_TABLET | Freq: Every day | ORAL | Status: DC
  Filled 2018-04-03: qty 2

## 2018-04-03 MED ORDER — CARVEDILOL 25 MG PO TABS: 25.0000 mg | ORAL_TABLET | Freq: Two times a day (BID) | ORAL | 0 refills | Status: DC

## 2018-04-03 MED ORDER — MINOXIDIL 2.5 MG PO TABS: 5.0000 mg | ORAL_TABLET | Freq: Every day | ORAL | 0 refills | Status: DC

## 2018-04-03 MED ORDER — AMLODIPINE BESYLATE 10 MG PO TABS: 10.0000 mg | ORAL_TABLET | Freq: Every day | ORAL | 0 refills | Status: DC

## 2018-04-03 MED ORDER — ISOSORBIDE MONONITRATE ER 30 MG PO TB24: 90.0000 mg | ORAL_TABLET | Freq: Every day | ORAL | 0 refills | Status: DC

## 2018-04-03 NOTE — Discharge Summary (Signed)
Anthony [name], is a 62 y.o. male  DOB 01/13/1956  MRN [id number].  Admission date:  03/27/2018  Admitting Physician  Jani Gravel, MD  Discharge Date:  04/03/2018   Primary MD  Secundino Ginger, PA-C  Recommendations for primary care physician for things to follow:    Hypertensive urgency-evidences of endorgan damage since January 2019-  Renal artery ultrasound 7/25->negative for stenosis STOPPED Hyzaar due to ARF Cont carvedilol 25mg  po bid Cont hydralazine 100mg  po tid Cont Imdur 90mg  po qday Cont Amlodipine 10mg  po qday Increase Minoxidil 5mg  po qday Please f/u with nephrology in 2 days regarding bp control   Troponin elevation  F/u with cardiology in 2-4 weeks  Cardiomyopathy (EF 35-40%) F/u with cardiology in 2-4 weeks Appreciate cardiology input  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 F/u with nephrology in 2 days regarding creatinine Defer to nephrology on next cmp  Diabetes mellitus type 2 A1c 11.1 Cont glipizide  Obstructive sleep apnea OHSS habitus-has a CPAP machine but once again is noncompliant- Please f/u with pcp  Body mass index is 33.49 kg/m.Could do with weight loss counseling Please f/u with pcp  Hypokalemia-resolved, MAG wnl 2.0 Defer to nephrology on next cmp        Admission Diagnosis  Elevated troponin [R74.8] Hypertensive emergency [I16.1] Acute heart failure, unspecified heart failure type ([place]) [I50.9] Chronic kidney disease, unspecified CKD stage [N18.9]   Discharge Diagnosis  Elevated troponin [R74.8] Hypertensive emergency [I16.1] Acute heart failure, unspecified heart failure type ([place]) [I50.9] Chronic kidney disease, unspecified CKD stage [N18.9]     Principal Problem:   Hypertensive emergency Active Problems:   Acute CHF (congestive heart failure) (HCC)   DM (diabetes mellitus), type 2 with renal complications (HCC)   OSA (obstructive sleep apnea)  Elevated troponin   Hypertensive urgency   Acute on chronic combined systolic and diastolic CHF (congestive heart failure) (HCC)   Acute renal failure with acute tubular necrosis superimposed on stage 4 chronic kidney disease (HCC)   DCM (dilated cardiomyopathy) ([place])   Chronic kidney disease      Past Medical History:  Diagnosis Date  . CHF (congestive heart failure) ([place])   . Diabetes mellitus without complication ([place])   . Hypertension   . Renal disorder     History reviewed. No pertinent surgical history.     HPI  from the history and physical done on the day of admission:     62 y.o.malewithhistory of chronic kidney disease likely stage IV patient does not recall his last creatinine follows of blood nephrologist at [facility] no recent blood work in our system with history of diabetes mellitus type 2 hypertension hyperlipidemia sleep apnea presents to the ER at [facility] with complaints of shortness of breath. Patient has been having exertional shortness of breath and also orthopnea over the last 2 weeks which has been gradually worsening. Denies any chest pain or productive cough. Denies any headache or any visual symptoms or focal deficits. Patient states he may have missed couple of  his doses of his antihypertensive last 2 days.  ED Course:In the ER patient blood pressure was more than 161 systolic and diastolic 0/05/6044 20. Was started on nitroglycerin infusion and since patient had shortness of breath and chest x-ray showing congestion Lasix 20 mg IV was given and also hydralazine 1 dose p.o. was given which she had not taken today. 1 dose of IV labetalol dose was given. At the time of my exam patient is not in distress denies any chest pain. Patient admitted for hypertensive urgency and acute congestive heart failure.        Hospital Course:     Pt was admitted for hypertensive urgency and ARF and troponin elevation.  Hyzaar was held and lasix  was held and pt was started on iv nitro and iv labetalol.  Renal artery ultrasound 7/25 negative for RAS.  Pt was converted to Toprol XL=> Carvedilol which was titrated up to 25mg  po bid, and cardiology was consulted and started Bidil and then increased to 2 po tid and then converted to hydralazine 100mg  po tid.  Along with Imdur 90mg  po qday.  Amlodipine was initiated 7/26  and increased to 10mg  po qday.  Minoxidil was started on 7./26 and really helped his bp.  Pt renal function stabilized and pt was started on lasix 20mg  po qday   I think his dyspnea was secondary to hypertensive urgency.  Cardiology also ordered nuclear stress test for troponin elevation. This was done on 7/26 and was negative for ischemia. EF 37%.  Pt's bp has stabilized.  He denies any dyspnea or chest pain.  Nephrology was in agreement with the changes above.  Pt will be discharge to home with follow up with nephrology.  His Bun/ creatinine 7/28=> 50/4.07.      Follow UP  Follow-up Information    Sueanne Margarita, MD Follow up.   Specialty:  Cardiology Why:  Office will call you to schedule followup with cardiology. Contact information: [address] [place] [address] [phone]        Dimas Aguas, MD Follow up in 2 day(s).   Specialty:  Internal Medicine Why:  please go to your appointment on Tuesday Contact information: [address] [place] [address] [phone]        Secundino Ginger, PA-C Follow up in 2 week(s).   Specialty:  Cardiology Contact information: [facility]  [address] [place] [address] [phone]            Consults obtained - cardiology , nephrology  Discharge Condition: stable  Diet and Activity recommendation: See Discharge Instructions below  Discharge Instructions      Discharge Medications     Allergies as of 04/03/2018   No Known Allergies     Medication List    STOP taking these medications   diltiazem  240 MG 24 hr capsule Commonly known as:  CARDIZEM CD   doxazosin 4 MG tablet Commonly known as:  CARDURA   isosorbide dinitrate 20 MG tablet Commonly known as:  ISORDIL   labetalol 200 MG tablet Commonly known as:  NORMODYNE   losartan-hydrochlorothiazide 100-25 MG tablet Commonly known as:  HYZAAR   torsemide 20 MG tablet Commonly known as:  DEMADEX     TAKE these medications   amLODipine 10 MG tablet Commonly known as:  NORVASC Take 1 tablet (10 mg total) by mouth daily. Start taking on:  04/04/2018   aspirin 81 MG tablet Take  81 mg by mouth daily.   atorvastatin 20 MG tablet Commonly known as:  LIPITOR Take 20 mg by mouth daily.   carvedilol 25 MG tablet Commonly known as:  COREG Take 1 tablet (25 mg total) by mouth 2 (two) times daily with a meal.   feeding supplement (PRO-STAT SUGAR FREE 64) Liqd Take 30 mLs by mouth 2 (two) times daily.   fish oil-omega-3 fatty acids 1000 MG capsule Take 1 g by mouth daily.   furosemide 20 MG tablet Commonly known as:  LASIX Take 20 mg by mouth daily.   glimepiride 4 MG tablet Commonly known as:  AMARYL Take 4 mg by mouth daily before breakfast.   hydrALAZINE 50 MG tablet Commonly known as:  APRESOLINE Take 100 mg by mouth 3 (three) times daily.   isosorbide mononitrate 30 MG 24 hr tablet Commonly known as:  IMDUR Take 3 tablets (90 mg total) by mouth daily. Start taking on:  04/04/2018   minoxidil 2.5 MG tablet Commonly known as:  LONITEN Take 2 tablets (5 mg total) by mouth daily.   tamsulosin 0.4 MG Caps capsule Commonly known as:  FLOMAX Take 0.4 mg by mouth.   Vitamin D (Ergocalciferol) 50000 units Caps capsule Commonly known as:  DRISDOL Take 50,000 Units by mouth every 7 (seven) days.       Major procedures and Radiology Reports - PLEASE review detailed and final reports for all details, in brief -       Dg Chest 2 View  Result Date: 03/27/2018 CLINICAL DATA:  Wheezing for 2 weeks EXAM: CHEST  - 2 VIEW COMPARISON:  This 09/14/2017 FINDINGS: Mild cardiomegaly is unchanged. Pulmonary arteries remain enlarged. There is central pulmonary vascular congestion without overt edema. No pleural effusion or pneumothorax. No focal consolidation. IMPRESSION: Cardiomegaly with pulmonary vascular congestion, but no overt pulmonary edema. Electronically Signed   By: Ulyses Jarred M.D.   On: 03/27/2018 16:06   Nm Myocar Multi W/spect W/wall Motion / Ef  Result Date: 04/01/2018  There was no ST segment deviation noted during stress.  No T wave inversion was noted during stress.  Defect 1: There is a medium defect of moderate severity.  This is an intermediate risk study.  Nuclear stress EF: 37%.  Moderate size and intensity fixed inferoapical and apical perfusion defect, likely artifact or possibly scar. No reversible ischemia (SDS 0). LVEF 37% with moderate LV cavity dilatation and severe global hypokinesis. This is an intermediate risk study.    Micro Results      Recent Results (from the past 240 hour(s))  MRSA PCR Screening     Status: None   Collection Time: 03/27/18  8:26 PM  Result Value Ref Range Status   MRSA by PCR NEGATIVE NEGATIVE Final    Comment:        The GeneXpert MRSA Assay (FDA approved for NASAL specimens only), is one component of a comprehensive MRSA colonization surveillance program. It is not intended to diagnose MRSA infection nor to guide or monitor treatment for MRSA infections. Performed at [facility], [place] [address], [place], [place] [address]        Today   Subjective    Isamu Minchew today states that his breathing is excellent.  no headache,no chest pain, no  abdominal pain,no new weakness tingling or numbness, feels much better wants to go home today.   Objective   Blood pressure (!) 146/96, pulse 82, temperature 98.4 F (36.9 C), temperature source Oral, resp.  rate 12, height 6' (1.829 m), weight 114 kg (251 lb 5.3 oz),  SpO2 96 %.   Intake/Output Summary (Last 24 hours) at 04/03/2018 1104 Last data filed at 04/03/2018 0900 Gross per 24 hour  Intake 1090 ml  Output 550 ml  Net 540 ml    Exam Awake Alert, Oriented x 3, No new F.N deficits, Normal affect [place].AT,PERRAL Supple Neck,No JVD, No cervical lymphadenopathy appriciated.  Symmetrical Chest wall movement, Good air movement bilaterally, CTAB RRR,No Gallops,Rubs or new Murmurs, No Parasternal Heave +ve B.Sounds, Abd Soft, Non tender, No organomegaly appriciated, No rebound -guarding or rigidity. No Cyanosis, Clubbing or edema, No new Rash or bruise   Data Review   CBC w Diff:  Lab Results  Component Value Date   WBC 4.0 04/03/2018   HGB 10.9 (L) 04/03/2018   HCT 33.9 (L) 04/03/2018   PLT 183 04/03/2018   LYMPHOPCT 15 03/27/2018   MONOPCT 9 03/27/2018   EOSPCT 2 03/27/2018   BASOPCT 0 03/27/2018    CMP:  Lab Results  Component Value Date   NA 139 04/03/2018   K 3.5 04/03/2018   CL 110 04/03/2018   CO2 23 04/03/2018   BUN 50 (H) 04/03/2018   CREATININE 4.07 (H) 04/03/2018   PROT 5.4 (L) 04/03/2018   ALBUMIN 2.5 (L) 04/03/2018   BILITOT 0.4 04/03/2018   ALKPHOS 55 04/03/2018   AST 14 (L) 04/03/2018   ALT 16 04/03/2018  .   Total Time in preparing paper work, data evaluation and todays exam - 68 minutes  Jani Gravel M.D on 04/03/2018 at 11:04 AM  [facility]   Office  [phone]

## 2018-04-05 LAB — ALDOSTERONE + RENIN ACTIVITY W/ RATIO
ALDO / PRA RATIO: 49.5 — AB (ref 0.0–30.0)
ALDOSTERONE: 9.8 ng/dL (ref 0.0–30.0)
PRA LC/MS/MS: 0.198 ng/mL/h (ref 0.167–5.380)

## 2018-04-26 ENCOUNTER — Telehealth: Payer: Self-pay | Admitting: Physician Assistant

## 2018-04-26 NOTE — Telephone Encounter (Signed)
New Message    Barnet Glasgow with [facility] is calling, states the pts weight has been 8/5-246, 8/8- 226, 8/16-232,, 8/17-233 and 236 today. Also states the pt has been having some sob. Please call  Pt c/o Shortness Of Breath: STAT if SOB developed within the last 24 hours or pt is noticeably SOB on the phone  1. Are you currently SOB (can you hear that pt is SOB on the phone)? Pt not on the phone  2. How long have you been experiencing SOB?  3. Are you SOB when sitting or when up moving around?   4. Are you currently experiencing any other symptoms?

## 2018-04-26 NOTE — Telephone Encounter (Signed)
Spoke with the pt and informed him that per Dr Radford Pax, he needs to talk to his Nephrologist about his weigh gain and aid in managing his diuretics.  Pt verbalized understanding and agrees with this plan.  Pt states he has an appt with his Nephrologist tomorrow, and he will discuss this with him then.

## 2018-04-26 NOTE — Telephone Encounter (Signed)
He needs to talk with his nephrologist about weight gain as he is managing his diuretics

## 2018-04-26 NOTE — Telephone Encounter (Signed)
Spoke to patient in regards to his weight fluctuation and SOB.  He was told by his nephrologist to keep his weight between 227-235.    Today he is not SOB and weighs 236.  I told him to weigh himself over the next few days and monitor his SOB.  He is to report to [place] on Friday as to how things are going.  He drinks 2-3 diet sodas per day and I told him to try to switch to water.

## 2018-05-05 DIAGNOSIS — E785 Hyperlipidemia, unspecified: Secondary | ICD-10-CM | POA: Insufficient documentation

## 2018-05-05 NOTE — Progress Notes (Signed)
Cardiology Office Note    Date:  05/10/2018   ID:  Anthony [name], DOB [birth year], MRN [id number]  PCP:  Patient, No Pcp Per  Cardiologist: Fransico Him, MD  No chief complaint on file.   History of Present Illness:  Anthony [name] is a 62 y.o. 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 Renal has been managing diuretics. Patient also has DM-Hgb A1C 11, HLD with LDL 152 Lipitor increased to 80 mg.  Patient comes in today for follow-up.  He says his kidney doctor 2 weeks ago and says his medications were not changed but later on says they may have been.  He did not bring his medicines with him and has screenshots of medications he may be taking.  Blood pressure is high today but he has not taken any of his medications.  He says he is taking nifedipine 90 mg daily instead of amlodipine.  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  Says his blood pressure is about 130/70 at home.    Past Medical History:  Diagnosis Date  . CHF (congestive heart failure) ([place])   . Diabetes mellitus without complication ([place])   . Hypertension   . Renal disorder     No past surgical history on file.  Current Medications: Current Meds  Medication Sig  . amLODipine (NORVASC) 10 MG tablet Take 1 tablet (10 mg total) by mouth daily.  Marland Kitchen aspirin 81 MG tablet Take 81 mg by mouth daily.  . carvedilol (COREG) 25 MG tablet Take 1 tablet (25 mg total) by mouth 2 (two) times daily with a meal.  . fish oil-omega-3 fatty acids 1000 MG capsule Take 1 g by mouth daily.  . furosemide (LASIX) 20 MG tablet Take 20 mg by mouth daily.  Marland Kitchen glimepiride (AMARYL) 4 MG tablet Take 4 mg by  mouth daily before breakfast.  . isosorbide mononitrate (IMDUR) 30 MG 24 hr tablet Take 3 tablets (90 mg total) by mouth daily.  . minoxidil (LONITEN) 2.5 MG tablet Take 2 tablets (5 mg total) by mouth daily.  . tamsulosin (FLOMAX) 0.4 MG CAPS capsule Take 0.4 mg by mouth.  . [DISCONTINUED] atorvastatin (LIPITOR) 20 MG tablet Take 20 mg by mouth daily.  . [DISCONTINUED] hydrALAZINE (APRESOLINE) 50 MG tablet Take 50 mg by mouth 3 (three) times daily.      Allergies:   Patient has no known allergies.   Social History   Socioeconomic History  . Marital status: Married    Spouse name: Not on file  . Number of children: Not on file  . Years of education: Not on file  . Highest education level: Not on file  Occupational History  . Not on file  Social Needs  . Financial resource strain: Not on file  . Food insecurity:    Worry: Not on file    Inability: Not on file  . Transportation needs:    Medical: Not on file    Non-medical: Not on file  Tobacco Use  . Smoking status: Never Smoker  . Smokeless tobacco: Never Used  Substance and Sexual Activity  . Alcohol use: Yes    Comment: occasional  . Drug use: No  .  Sexual activity: Not on file  Lifestyle  . Physical activity:    Days per week: Not on file    Minutes per session: Not on file  . Stress: Not on file  Relationships  . Social connections:    Talks on phone: Not on file    Gets together: Not on file    Attends religious service: Not on file    Active member of club or organization: Not on file    Attends meetings of clubs or organizations: Not on file    Relationship status: Not on file  Other Topics Concern  . Not on file  Social History Narrative  . Not on file     Family History:  The patient's family history includes Congestive Heart Failure in his mother; Diabetes Mellitus II in his father; Hypertension in his father.   ROS:   Please see the history of present illness.    Review of Systems  Constitution:  Negative.  HENT: Negative.   Cardiovascular: Negative.   Respiratory: Negative.   Endocrine: Negative.   Hematologic/Lymphatic: Negative.   Musculoskeletal: Negative.   Gastrointestinal: Negative.   Genitourinary: Negative.   Neurological: Negative.    All other systems reviewed and are negative.   PHYSICAL EXAM:   VS:  BP (!) 164/90 (BP Location: Left Arm, Patient Position: Sitting)   Pulse 78   Ht 6' (1.829 m)   Wt 240 lb (108.9 kg)   SpO2 98%   BMI 32.55 kg/m   Physical Exam  GEN: Well nourished, well developed, in no acute distress  HEENT: normal  Neck: no JVD, carotid bruits, or masses Cardiac:RRR; no murmurs, rubs, or gallops  Respiratory:  clear to auscultation bilaterally, normal work of breathing GI: soft, nontender, nondistended, + BS Ext: without cyanosis, clubbing, or edema, Good distal pulses bilaterally MS: no deformity or atrophy  Skin: warm and dry, no rash Neuro:  Alert and Oriented x 3, Strength and sensation are intact Psych: euthymic mood, full affect  Wt Readings from Last 3 Encounters:  05/10/18 240 lb (108.9 kg)  04/03/18 251 lb 5.3 oz (114 kg)  09/14/17 279 lb (126.6 kg)      Studies/Labs Reviewed:   EKG:  EKG is not ordered today.   Recent Labs: 03/27/2018: B Natriuretic Peptide 2,864.7; TSH 0.728 03/29/2018: Magnesium 2.0 04/03/2018: ALT 16; BUN 50; Creatinine, Ser 4.07; Hemoglobin 10.9; Platelets 183; Potassium 3.5; Sodium 139   Lipid Panel    Component Value Date/Time   CHOL 207 (H) 03/30/2018 0544   TRIG 151 (H) 03/30/2018 0544   HDL 25 (L) 03/30/2018 0544   CHOLHDL 8.3 03/30/2018 0544   VLDL 30 03/30/2018 0544   LDLCALC 152 (H) 03/30/2018 0544    Additional studies/ records that were reviewed today include:  Echo 7/22/19Study Conclusions   - Left ventricle: The cavity size was normal. There was mild   concentric hypertrophy. Systolic function was moderately reduced.   The estimated ejection fraction was in the range of 35% to  40%.   Moderate diffuse hypokinesis with distinct regional wall motion   abnormalities. Probable disproportionate hypokinesis of the   anterolateral and inferolateral myocardium; consistent with   ischemia in the distribution of the left circumflex coronary   artery. Doppler parameters are consistent with restrictive   physiology, indicative of decreased left ventricular diastolic   compliance and/or increased left atrial pressure. - Mitral valve: There was mild regurgitation. - Left atrium: The atrium was moderately to severely dilated.  Nuclear stress 7/26/19There was no ST segment deviation noted during stress.  No T wave inversion was noted during stress.  Defect 1: There is a medium defect of moderate severity.  This is an intermediate risk study.  Nuclear stress EF: 37%.   Moderate size and intensity fixed inferoapical and apical perfusion defect, likely artifact or possibly scar. No reversible ischemia (SDS 0). LVEF 37% with moderate LV cavity dilatation and severe global hypokinesis. This is an intermediate risk study.   Renal US 7/25/19FINAL INTERPRETATION: Renal:   Right: No evidence of right renal artery stenosis. Normal right        Resisitive Index. Left:  No evidence of left renal artery stenosis. Normal left        Resistive Index. *See table(s) above for measurements and observations.   Diagnosing physician: Adele Barthel MD   Electronically signed by Adele Barthel MD on 03/31/2018 at 6:31:38 PM.      ASSESSMENT:    1. DCM (dilated cardiomyopathy) ([place])   2. Chronic systolic heart failure ([place])   3. Hypertensive urgency   4. Stage 4 chronic kidney disease ([place])   5. Mixed hyperlipidemia      PLAN:  In order of problems listed above:  Dilated cardiomyopathy LVEF 35-40% on recent echo with WMA but nuclear stress test with fixed defect and no ischemia. No chest pain and CKD so medical therapy.  Patient's medication list is not accurate.  We think he is on  Coreg and Imdur as well as hydralazine but dosage may be inaccurate as well.  He needs to bring his medications in to the hypertensive clinic to verify.  Chronic systolic CHF with fluctuating weights managed by renal.  No evidence of heart failure on exam today.  Hypertensive urgency-meds changed in hospital.  Blood pressure is up today but he has not taken any of his medicines and we are not sure what medications he is actually on.  We will arrange for him to bring his medications in with him and be seen in the hypertensive clinic.  CKD stage IV-V managed by renal  Hyperlipidemia LDL 152 with increase in lipitor to 80 mg daily. Repeat lipids and LFT in 6 weeks.  Patient's list says he is only on 20 mg once daily.  Will wait to draw labs until he is taking the 80 mg daily.  DM uncontrolled Hgb A1C over 11 managed by PCP     Medication Adjustments/Labs and Tests Ordered: Current medicines are reviewed at length with the patient today.  Concerns regarding medicines are outlined above.  Medication changes, Labs and Tests ordered today are listed in the Patient Instructions below. Patient Instructions  Medication Instructions:  Your physician recommends that you continue on your current medications as directed. Please refer to the Current Medication list given to you today.  1. You should be taking hydralazine 100 mg three times a day  2. You should be taking atorvastatin 80 mg daily  Labwork: Your physician recommends that you return for a FASTING lipid profile and liver function panel: 2-3 months   Testing/Procedures: None ordered  Follow-Up: Your physician recommends that you schedule a follow-up appointment in: 2 weeks in the Hypertension Clinic for Blood Pressure Management  Your physician recommends that you schedule a follow-up appointment in: 2 months with Dr. Radford Pax   Any Other Special Instructions Will Be Listed Below (If Applicable).    Low-Sodium Eating Plan Sodium,  which is an element that makes up salt, helps you maintain  a healthy balance of fluids in your body. Too much sodium can increase your blood pressure and cause fluid and waste to be held in your body. Your health care provider or dietitian may recommend following this plan if you have high blood pressure (hypertension), kidney disease, liver disease, or heart failure. Eating less sodium can help lower your blood pressure, reduce swelling, and protect your heart, liver, and kidneys. What are tips for following this plan? General guidelines  Most people on this plan should limit their sodium intake to 1,500-2,000 mg (milligrams) of sodium each day. Reading food labels  The Nutrition Facts label lists the amount of sodium in one serving of the food. If you eat more than one serving, you must multiply the listed amount of sodium by the number of servings.  Choose foods with less than 140 mg of sodium per serving.  Avoid foods with 300 mg of sodium or more per serving. Shopping  Look for lower-sodium products, often labeled as "low-sodium" or "no salt added."  Always check the sodium content even if foods are labeled as "unsalted" or "no salt added".  Buy fresh foods. ? Avoid canned foods and premade or frozen meals. ? Avoid canned, cured, or processed meats  Buy breads that have less than 80 mg of sodium per slice. Cooking  Eat more home-cooked food and less restaurant, buffet, and fast food.  Avoid adding salt when cooking. Use salt-free seasonings or herbs instead of table salt or sea salt. Check with your health care provider or pharmacist before using salt substitutes.  Cook with plant-based oils, such as canola, sunflower, or olive oil. Meal planning  When eating at a restaurant, ask that your food be prepared with less salt or no salt, if possible.  Avoid foods that contain MSG (monosodium glutamate). MSG is sometimes added to [place] food, bouillon, and some canned foods. What  foods are recommended? The items listed may not be a complete list. Talk with your dietitian about what dietary choices are best for you. Grains Low-sodium cereals, including oats, puffed wheat and rice, and shredded wheat. Low-sodium crackers. Unsalted rice. Unsalted pasta. Low-sodium bread. Whole-grain breads and whole-grain pasta. Vegetables Fresh or frozen vegetables. "No salt added" canned vegetables. "No salt added" tomato sauce and paste. Low-sodium or reduced-sodium tomato and vegetable juice. Fruits Fresh, frozen, or canned fruit. Fruit juice. Meats and other protein foods Fresh or frozen (no salt added) meat, poultry, seafood, and fish. Low-sodium canned tuna and salmon. Unsalted nuts. Dried peas, beans, and lentils without added salt. Unsalted canned beans. Eggs. Unsalted nut butters. Dairy Milk. Soy milk. Cheese that is naturally low in sodium, such as ricotta cheese, fresh mozzarella, or Swiss cheese Low-sodium or reduced-sodium cheese. Cream cheese. Yogurt. Fats and oils Unsalted butter. Unsalted margarine with no trans fat. Vegetable oils such as canola or olive oils. Seasonings and other foods Fresh and dried herbs and spices. Salt-free seasonings. Low-sodium mustard and ketchup. Sodium-free salad dressing. Sodium-free light mayonnaise. Fresh or refrigerated horseradish. Lemon juice. Vinegar. Homemade, reduced-sodium, or low-sodium soups. Unsalted popcorn and pretzels. Low-salt or salt-free chips. What foods are not recommended? The items listed may not be a complete list. Talk with your dietitian about what dietary choices are best for you. Grains Instant hot cereals. Bread stuffing, pancake, and biscuit mixes. Croutons. Seasoned rice or pasta mixes. Noodle soup cups. Boxed or frozen macaroni and cheese. Regular salted crackers. Self-rising flour. Vegetables Sauerkraut, pickled vegetables, and relishes. Olives. [place] fries. Onion rings. Regular canned  vegetables (not low-sodium  or reduced-sodium). Regular canned tomato sauce and paste (not low-sodium or reduced-sodium). Regular tomato and vegetable juice (not low-sodium or reduced-sodium). Frozen vegetables in sauces. Meats and other protein foods Meat or fish that is salted, canned, smoked, spiced, or pickled. Bacon, ham, sausage, hotdogs, corned beef, chipped beef, packaged lunch meats, salt pork, jerky, pickled herring, anchovies, regular canned tuna, sardines, salted nuts. Dairy Processed cheese and cheese spreads. Cheese curds. Blue cheese. Feta cheese. String cheese. Regular cottage cheese. Buttermilk. Canned milk. Fats and oils Salted butter. Regular margarine. Ghee. Bacon fat. Seasonings and other foods Onion salt, garlic salt, seasoned salt, table salt, and sea salt. Canned and packaged gravies. Worcestershire sauce. Tartar sauce. Barbecue sauce. Teriyaki sauce. Soy sauce, including reduced-sodium. Steak sauce. Fish sauce. Oyster sauce. Cocktail sauce. Horseradish that you find on the shelf. Regular ketchup and mustard. Meat flavorings and tenderizers. Bouillon cubes. Hot sauce and Tabasco sauce. Premade or packaged marinades. Premade or packaged taco seasonings. Relishes. Regular salad dressings. Salsa. Potato and tortilla chips. Corn chips and puffs. Salted popcorn and pretzels. Canned or dried soups. Pizza. Frozen entrees and pot pies. Summary  Eating less sodium can help lower your blood pressure, reduce swelling, and protect your heart, liver, and kidneys.  Most people on this plan should limit their sodium intake to 1,500-2,000 mg (milligrams) of sodium each day.  Canned, boxed, and frozen foods are high in sodium. Restaurant foods, fast foods, and pizza are also very high in sodium. You also get sodium by adding salt to food.  Try to cook at home, eat more fresh fruits and vegetables, and eat less fast food, canned, processed, or prepared foods. This information is not intended to replace advice given to  you by your health care provider. Make sure you discuss any questions you have with your health care provider. Document Released: 02/13/2002 Document Revised: 08/17/2016 Document Reviewed: 08/17/2016 Elsevier Interactive Patient Education  [employer].   If you need a refill on your cardiac medications before your next appointment, please call your pharmacy.     Sumner Boast, PA-C  05/10/2018 10:48 AM    [facility] [place], [place], [place]  [address] Phone: [phone]; Fax: [phone]

## 2018-05-06 ENCOUNTER — Encounter: Payer: Self-pay | Admitting: Physician Assistant

## 2018-05-10 ENCOUNTER — Encounter: Payer: Self-pay | Admitting: Physician Assistant

## 2018-05-10 ENCOUNTER — Ambulatory Visit (INDEPENDENT_AMBULATORY_CARE_PROVIDER_SITE_OTHER): Payer: 59 | Admitting: Physician Assistant

## 2018-05-10 ENCOUNTER — Encounter (INDEPENDENT_AMBULATORY_CARE_PROVIDER_SITE_OTHER): Payer: Self-pay

## 2018-05-10 ENCOUNTER — Encounter

## 2018-05-10 VITALS — BP 164/90 | HR 78 | Ht 72.0 in | Wt 240.0 lb

## 2018-05-10 DIAGNOSIS — I5022 Chronic systolic (congestive) heart failure: Secondary | ICD-10-CM

## 2018-05-10 DIAGNOSIS — I160 Hypertensive urgency: Secondary | ICD-10-CM

## 2018-05-10 DIAGNOSIS — N184 Chronic kidney disease, stage 4 (severe): Secondary | ICD-10-CM | POA: Diagnosis not present

## 2018-05-10 DIAGNOSIS — I420 Dilated cardiomyopathy: Secondary | ICD-10-CM

## 2018-05-10 DIAGNOSIS — E782 Mixed hyperlipidemia: Secondary | ICD-10-CM

## 2018-05-10 MED ORDER — ATORVASTATIN CALCIUM 80 MG PO TABS: 80.0000 mg | ORAL_TABLET | Freq: Every day | ORAL | 3 refills | Status: DC

## 2018-05-10 MED ORDER — HYDRALAZINE HCL 50 MG PO TABS: 100.0000 mg | ORAL_TABLET | Freq: Three times a day (TID) | ORAL | 3 refills | Status: DC

## 2018-05-10 NOTE — Patient Instructions (Addendum)
Medication Instructions:  Your physician recommends that you continue on your current medications as directed. Please refer to the Current Medication list given to you today.  1. You should be taking hydralazine 100 mg three times a day  2. You should be taking atorvastatin 80 mg daily  Labwork: Your physician recommends that you return for a FASTING lipid profile and liver function panel: 2-3 months   Testing/Procedures: None ordered  Follow-Up: Your physician recommends that you schedule a follow-up appointment in: 2 weeks in the Hypertension Clinic for Blood Pressure Management  Your physician recommends that you schedule a follow-up appointment in: 2 months with Dr. Radford Pax   Any Other Special Instructions Will Be Listed Below (If Applicable).    Low-Sodium Eating Plan Sodium, which is an element that makes up salt, helps you maintain a healthy balance of fluids in your body. Too much sodium can increase your blood pressure and cause fluid and waste to be held in your body. Your health care provider or dietitian may recommend following this plan if you have high blood pressure (hypertension), kidney disease, liver disease, or heart failure. Eating less sodium can help lower your blood pressure, reduce swelling, and protect your heart, liver, and kidneys. What are tips for following this plan? General guidelines  Most people on this plan should limit their sodium intake to 1,500-2,000 mg (milligrams) of sodium each day. Reading food labels  The Nutrition Facts label lists the amount of sodium in one serving of the food. If you eat more than one serving, you must multiply the listed amount of sodium by the number of servings.  Choose foods with less than 140 mg of sodium per serving.  Avoid foods with 300 mg of sodium or more per serving. Shopping  Look for lower-sodium products, often labeled as "low-sodium" or "no salt added."  Always check the sodium content even if  foods are labeled as "unsalted" or "no salt added".  Buy fresh foods. ? Avoid canned foods and premade or frozen meals. ? Avoid canned, cured, or processed meats  Buy breads that have less than 80 mg of sodium per slice. Cooking  Eat more home-cooked food and less restaurant, buffet, and fast food.  Avoid adding salt when cooking. Use salt-free seasonings or herbs instead of table salt or sea salt. Check with your health care provider or pharmacist before using salt substitutes.  Cook with plant-based oils, such as canola, sunflower, or olive oil. Meal planning  When eating at a restaurant, ask that your food be prepared with less salt or no salt, if possible.  Avoid foods that contain MSG (monosodium glutamate). MSG is sometimes added to [place] food, bouillon, and some canned foods. What foods are recommended? The items listed may not be a complete list. Talk with your dietitian about what dietary choices are best for you. Grains Low-sodium cereals, including oats, puffed wheat and rice, and shredded wheat. Low-sodium crackers. Unsalted rice. Unsalted pasta. Low-sodium bread. Whole-grain breads and whole-grain pasta. Vegetables Fresh or frozen vegetables. "No salt added" canned vegetables. "No salt added" tomato sauce and paste. Low-sodium or reduced-sodium tomato and vegetable juice. Fruits Fresh, frozen, or canned fruit. Fruit juice. Meats and other protein foods Fresh or frozen (no salt added) meat, poultry, seafood, and fish. Low-sodium canned tuna and salmon. Unsalted nuts. Dried peas, beans, and lentils without added salt. Unsalted canned beans. Eggs. Unsalted nut butters. Dairy Milk. Soy milk. Cheese that is naturally low in sodium, such as ricotta cheese, fresh  mozzarella, or Swiss cheese Low-sodium or reduced-sodium cheese. Cream cheese. Yogurt. Fats and oils Unsalted butter. Unsalted margarine with no trans fat. Vegetable oils such as canola or olive oils. Seasonings and  other foods Fresh and dried herbs and spices. Salt-free seasonings. Low-sodium mustard and ketchup. Sodium-free salad dressing. Sodium-free light mayonnaise. Fresh or refrigerated horseradish. Lemon juice. Vinegar. Homemade, reduced-sodium, or low-sodium soups. Unsalted popcorn and pretzels. Low-salt or salt-free chips. What foods are not recommended? The items listed may not be a complete list. Talk with your dietitian about what dietary choices are best for you. Grains Instant hot cereals. Bread stuffing, pancake, and biscuit mixes. Croutons. Seasoned rice or pasta mixes. Noodle soup cups. Boxed or frozen macaroni and cheese. Regular salted crackers. Self-rising flour. Vegetables Sauerkraut, pickled vegetables, and relishes. Olives. [place] fries. Onion rings. Regular canned vegetables (not low-sodium or reduced-sodium). Regular canned tomato sauce and paste (not low-sodium or reduced-sodium). Regular tomato and vegetable juice (not low-sodium or reduced-sodium). Frozen vegetables in sauces. Meats and other protein foods Meat or fish that is salted, canned, smoked, spiced, or pickled. Bacon, ham, sausage, hotdogs, corned beef, chipped beef, packaged lunch meats, salt pork, jerky, pickled herring, anchovies, regular canned tuna, sardines, salted nuts. Dairy Processed cheese and cheese spreads. Cheese curds. Blue cheese. Feta cheese. String cheese. Regular cottage cheese. Buttermilk. Canned milk. Fats and oils Salted butter. Regular margarine. Ghee. Bacon fat. Seasonings and other foods Onion salt, garlic salt, seasoned salt, table salt, and sea salt. Canned and packaged gravies. Worcestershire sauce. Tartar sauce. Barbecue sauce. Teriyaki sauce. Soy sauce, including reduced-sodium. Steak sauce. Fish sauce. Oyster sauce. Cocktail sauce. Horseradish that you find on the shelf. Regular ketchup and mustard. Meat flavorings and tenderizers. Bouillon cubes. Hot sauce and Tabasco sauce. Premade or packaged  marinades. Premade or packaged taco seasonings. Relishes. Regular salad dressings. Salsa. Potato and tortilla chips. Corn chips and puffs. Salted popcorn and pretzels. Canned or dried soups. Pizza. Frozen entrees and pot pies. Summary  Eating less sodium can help lower your blood pressure, reduce swelling, and protect your heart, liver, and kidneys.  Most people on this plan should limit their sodium intake to 1,500-2,000 mg (milligrams) of sodium each day.  Canned, boxed, and frozen foods are high in sodium. Restaurant foods, fast foods, and pizza are also very high in sodium. You also get sodium by adding salt to food.  Try to cook at home, eat more fresh fruits and vegetables, and eat less fast food, canned, processed, or prepared foods. This information is not intended to replace advice given to you by your health care provider. Make sure you discuss any questions you have with your health care provider. Document Released: 02/13/2002 Document Revised: 08/17/2016 Document Reviewed: 08/17/2016 Elsevier Interactive Patient Education  [employer].   If you need a refill on your cardiac medications before your next appointment, please call your pharmacy.

## 2018-06-02 ENCOUNTER — Ambulatory Visit (INDEPENDENT_AMBULATORY_CARE_PROVIDER_SITE_OTHER): Payer: 59 | Admitting: Pharmacist

## 2018-06-02 DIAGNOSIS — I10 Essential (primary) hypertension: Secondary | ICD-10-CM | POA: Diagnosis not present

## 2018-06-02 MED ORDER — NIFEDIPINE ER OSMOTIC RELEASE 90 MG PO TB24: 90.0000 mg | ORAL_TABLET | Freq: Every day | ORAL | 3 refills | Status: AC

## 2018-06-02 MED ORDER — LABETALOL HCL 300 MG PO TABS: 900.0000 mg | ORAL_TABLET | Freq: Two times a day (BID) | ORAL | 11 refills | Status: AC

## 2018-06-02 NOTE — Patient Instructions (Addendum)
Take isosorbide 60mg  once a day  DECREASE your nifedipine 90mg  to once a day  INCREASE your labetalol to 900mg  (3 tablets) twice a day  INCREASE your atorvastatin to 80mg  once a day  Move your second dose of torsemide to early afternoon  Use Tylenol (acetaminophen) if you have any pain, avoid using ibuprofen (Advil) or (naproxen) Aleve - these can raise your blood pressure and damage your kidneys  Follow up in clinic in 4 weeks for blood pressure check   Call Megan, Pharmacist in blood pressure clinic with any questions or medication changes from your nephrologist

## 2018-06-02 NOTE — Progress Notes (Signed)
Patient ID: Anthony [name]                 DOB: Feb 02, 1956                      MRN: [id number]     HPI: Anthony [name] is a 62 y.o. male patient of Dr Radford Pax referred by Ermalinda Barrios, PA to HTN clinic. PMH is significant for acute on chronic systolic CHF with LVEF 71-06%, stage V CKD, and DM2. He was admitted for hypertensive urgency in July 2694 with systolic BP > 854. Renal artery ultrasound 03/31/18 negative for RAS. Diltiazem, doxazosin, labetalol, Hyzaar, and torsemide were discontinued at patient discharge. He was discharged on amlodipine, carvedilol, and furosemide. At last visit, he was unsure what medications he was taking and presents to HTN clinic for further management.  Pt presents today in good spirits. He took his BP medications today prior to visit. He brings in all of his home medications and most of his current medication list is wrong. Updates include:  - Pt taking nifedipine 90mg  BID, not amlodipine 10mg  daily. Highest max recommended dose is 90mg  daily. Pt has been noticing LEE, likely due to higher than usual dose being prescribed - Pt taking labetalol 600mg  BID, not carvedilol 25mg  BID - Pt taking torsemide 20mg  BID, not furosemide 20mg  daily - Pt not taking minoxidil any more (experienced drowsiness) - Pt is taking febuxostat - Pt is taking isosorbide 30mg  (cutting 60mg  tablet in half), not 90mg . Denies chest pain.  Patient has essentially continued taking most medications that he was on prior to July 2019 admission because his discharge prescriptions were only written for a 30 day supply with no refills. The pharmacy then refilled his old prescriptions since they were the only active ones on file and they typically do not get responses from refill requests sent to inpatient MDs.   Current HTN meds: nifedipine 90mg  BID, labetalol 600mg  BID, hydralazine 100mg  TID, Imdur 30mg  daily, torsemide 20mg  BID  Previously tried: Hyzaar - d/ced due to CKD, minoxidil - drowsiness, clonidine  0.1mg  BID - dizziness and had a fall, doxazosin  BP goal: <130/43mmHg  Family History: The patient's family history includes Congestive Heart Failure in his mother; Diabetes Mellitus II in his father; Hypertension in his father.   Social History: Denies tobacco, alcohol, and illicit drug use.  Diet: Likes sandwiches, pasta salad, bananas. Does not add salt to food. 1-2 diet sodas a day.  Exercise: Walks a lot with his job - on his feet for 8-10 hours a shift  Home BP readings: Does not check  Wt Readings from Last 3 Encounters:  05/10/18 240 lb (108.9 kg)  04/03/18 251 lb 5.3 oz (114 kg)  09/14/17 279 lb (126.6 kg)   BP Readings from Last 3 Encounters:  05/10/18 (!) 164/90  04/03/18 (!) 146/96  04/01/18 (!) 150/97   Pulse Readings from Last 3 Encounters:  05/10/18 78  04/03/18 82  04/01/18 79    Renal function: CrCl cannot be calculated (Patient's most recent lab result is older than the maximum 21 days allowed.).  Past Medical History:  Diagnosis Date  . CHF (congestive heart failure) ([place])   . Diabetes mellitus without complication ([place])   . Hypertension   . Renal disorder     Current Outpatient Medications on File Prior to Visit  Medication Sig Dispense Refill  . amLODipine (NORVASC) 10 MG tablet Take 1 tablet (10 mg total) by mouth daily. 30 tablet  0  . aspirin 81 MG tablet Take 81 mg by mouth daily.    Marland Kitchen atorvastatin (LIPITOR) 80 MG tablet Take 1 tablet (80 mg total) by mouth daily. 90 tablet 3  . carvedilol (COREG) 25 MG tablet Take 1 tablet (25 mg total) by mouth 2 (two) times daily with a meal. 60 tablet 0  . fish oil-omega-3 fatty acids 1000 MG capsule Take 1 g by mouth daily.    . furosemide (LASIX) 20 MG tablet Take 20 mg by mouth daily.    Marland Kitchen glimepiride (AMARYL) 4 MG tablet Take 4 mg by mouth daily before breakfast.    . hydrALAZINE (APRESOLINE) 50 MG tablet Take 2 tablets (100 mg total) by mouth 3 (three) times daily. 270 tablet 3  . isosorbide  mononitrate (IMDUR) 30 MG 24 hr tablet Take 3 tablets (90 mg total) by mouth daily. 90 tablet 0  . minoxidil (LONITEN) 2.5 MG tablet Take 2 tablets (5 mg total) by mouth daily. 30 tablet 0  . tamsulosin (FLOMAX) 0.4 MG CAPS capsule Take 0.4 mg by mouth.     No current facility-administered medications on file prior to visit.     No Known Allergies   Assessment/Plan:  1. Hypertension - Many medication discrepancies noted between our medication list (discharge med list from July 2019 encounter) and what patient is currently taking because discharge medications were only written for a 30 day supply and no refills. Pharmacy was only able to fill his prior medications since rx prescribed by inpatient MDs usually have issues with refill requests. See above notes for details. Will increase labetalol to 900mg  BID, decrease nifedipine to max recommended dose of 90mg  daily (should help improve LEE), and advised pt to move 2nd dose of torsemide to earlier in the day to avoid frequent nighttime urination. Pt will stop splitting Imdur in half (not recommended due to extended release formulation) and will start taking Imdur 60mg  daily. F/u in HTN clinic in 4 weeks. Future medication options limited due to CKD.   2. Hyperlipidemia - Pt still taking atorvastatin 40mg  daily and had not picked up new rx for 80mg  dose. He is aware to increase dose to 80mg  daily. Has f/u lipids scheduledl  Megan E. Supple, PharmD, BCACP, [place] [address], [place], [place] [address] Phone: [phone]; Fax: [phone] 06/02/2018 2:22 PM

## 2018-06-17 ENCOUNTER — Telehealth: Payer: Self-pay | Admitting: Cardiology

## 2018-06-17 NOTE — Telephone Encounter (Signed)
°  Anthony [name] from [facility] calling to report weight gain. Patient denies swelling  Janine G. [phone]   Pt c/o swelling: STAT is pt has developed SOB within 24 hours  1) How much weight have you gained and in what time span?   2) If swelling, where is the swelling located? NO  3) Are you currently taking a fluid pill? /NA  4) Are you currently SOB? NO  5) Do you have a log of your daily weights (if so, list)?   Monday 246 Tuesday 233 Wednesday 245 Thursday 249 Friday 245  6) Have you gained 3 pounds in a day or 5 pounds in a week?   7) Have you traveled recently? NO

## 2018-06-17 NOTE — Telephone Encounter (Signed)
Called patient about [facility] message. Patient stated that the weight on Tuesday is wrong. Patient stated his scale was not level with the floor, and when he took his weight again, it was 246 lbs. Patient denies SOB, swelling or chest pain. Informed patient that we would let his doctor know. Encouraged patient to keep a low salt diet, and give our office a call if he has any symptoms of SOB or swelling. Patient verbalized understanding.

## 2018-06-18 NOTE — Telephone Encounter (Signed)
What is his normal baseline weight

## 2018-06-20 NOTE — Telephone Encounter (Signed)
The patient's baseline weight is around 241. The patient's weight today was 242.

## 2018-06-20 NOTE — Telephone Encounter (Signed)
Continue current medical Rx

## 2018-06-22 NOTE — Telephone Encounter (Signed)
Spoke with the patient about Dr. Theodosia Blender recommendations, he expressed understanding and had no further questions.

## 2018-06-30 ENCOUNTER — Ambulatory Visit: Payer: 59 | Admitting: Pharmacist

## 2018-06-30 NOTE — Progress Notes (Deleted)
Patient ID: Anthony [name]                 DOB: 08-Dec-1955                      MRN: [id number]     HPI: Anthony [name] is a 62 y.o. male patient of Dr Radford Pax referred by Ermalinda Barrios, PA to HTN clinic. PMH is significant for acute on chronic systolic CHF with LVEF 82-42%, stage V CKD, and DM2. He was admitted for hypertensive urgency in July 3536 with systolic BP > 144. Renal artery ultrasound 03/31/18 negative for RAS. Diltiazem, doxazosin, labetalol, Hyzaar, and torsemide were discontinued at patient discharge. He was discharged on amlodipine, carvedilol, and furosemide. At last visit, he was taking nifedipine, labetalol, and torsemide.  On atorva 80mg  now? Confirm meds he's taking Change labetalol to carvedilol - HFrEF Taking other meds? SCr 4- no ACEI or spiro Try clonidine again?  Pt presents today in good spirits. He took his BP medications today prior to visit. He brings in all of his home medications and most of his current medication list is wrong. Updates include:  - Pt taking nifedipine 90mg  BID, not amlodipine 10mg  daily. Highest max recommended dose is 90mg  daily. Pt has been noticing LEE, likely due to higher than usual dose being prescribed - Pt taking labetalol 600mg  BID, not carvedilol 25mg  BID - Pt taking torsemide 20mg  BID, not furosemide 20mg  daily - Pt not taking minoxidil any more (experienced drowsiness) - Pt is taking febuxostat - Pt is taking isosorbide 30mg  (cutting 60mg  tablet in half), not 90mg . Denies chest pain.  Patient has essentially continued taking most medications that he was on prior to July 2019 admission because his discharge prescriptions were only written for a 30 day supply with no refills. The pharmacy then refilled his old prescriptions since they were the only active ones on file and they typically do not get responses from refill requests sent to inpatient MDs.   Current HTN meds: nifedipine 90mg  daily, labetalol 900mg  BID, hydralazine 100mg  TID,  Imdur 60mg  daily, torsemide 20mg  BID  Previously tried: Hyzaar - d/ced due to CKD, minoxidil - drowsiness, clonidine 0.1mg  BID - dizziness and had a fall, doxazosin  BP goal: <130/70mmHg  Family History: The patient's family history includes Congestive Heart Failure in his mother; Diabetes Mellitus II in his father; Hypertension in his father.   Social History: Denies tobacco, alcohol, and illicit drug use.  Diet: Likes sandwiches, pasta salad, bananas. Does not add salt to food. 1-2 diet sodas a day.  Exercise: Walks a lot with his job - on his feet for 8-10 hours a shift  Home BP readings: Does not check  Wt Readings from Last 3 Encounters:  05/10/18 240 lb (108.9 kg)  04/03/18 251 lb 5.3 oz (114 kg)  09/14/17 279 lb (126.6 kg)   BP Readings from Last 3 Encounters:  06/02/18 (!) 152/84  05/10/18 (!) 164/90  04/03/18 (!) 146/96   Pulse Readings from Last 3 Encounters:  06/02/18 66  05/10/18 78  04/03/18 82    Renal function: CrCl cannot be calculated (Patient's most recent lab result is older than the maximum 21 days allowed.).  Past Medical History:  Diagnosis Date  . CHF (congestive heart failure) ([place])   . Diabetes mellitus without complication ([place])   . Hypertension   . Renal disorder     Current Outpatient Medications on File Prior to Visit  Medication Sig Dispense Refill  .  aspirin 81 MG tablet Take 81 mg by mouth daily.    Marland Kitchen atorvastatin (LIPITOR) 80 MG tablet Take 1 tablet (80 mg total) by mouth daily. 90 tablet 3  . calcitRIOL (ROCALTROL) 0.5 MCG capsule Take 0.5 mcg by mouth daily.    . febuxostat (ULORIC) 40 MG tablet Take 40 mg by mouth daily.    . fish oil-omega-3 fatty acids 1000 MG capsule Take 1 g by mouth daily.    Marland Kitchen glimepiride (AMARYL) 4 MG tablet Take 4 mg by mouth daily before breakfast.    . hydrALAZINE (APRESOLINE) 100 MG tablet Take 100 mg by mouth 3 (three) times daily.    . isosorbide mononitrate (IMDUR) 60 MG 24 hr tablet Take 60 mg by  mouth daily.    Marland Kitchen labetalol (NORMODYNE) 300 MG tablet Take 3 tablets (900 mg total) by mouth 2 (two) times daily. 180 tablet 11  . NIFEdipine (PROCARDIA XL/ADALAT-CC) 90 MG 24 hr tablet Take 1 tablet (90 mg total) by mouth daily. 90 tablet 3  . tamsulosin (FLOMAX) 0.4 MG CAPS capsule Take 0.4 mg by mouth daily.     Marland Kitchen torsemide (DEMADEX) 20 MG tablet Take 20 mg by mouth 2 (two) times daily.     No current facility-administered medications on file prior to visit.     No Known Allergies   Assessment/Plan:  1. Hypertension - Many medication discrepancies noted between our medication list (discharge med list from July 2019 encounter) and what patient is currently taking because discharge medications were only written for a 30 day supply and no refills. Pharmacy was only able to fill his prior medications since rx prescribed by inpatient MDs usually have issues with refill requests. See above notes for details. Will increase labetalol to 900mg  BID, decrease nifedipine to max recommended dose of 90mg  daily (should help improve LEE), and advised pt to move 2nd dose of torsemide to earlier in the day to avoid frequent nighttime urination. Pt will stop splitting Imdur in half (not recommended due to extended release formulation) and will start taking Imdur 60mg  daily. F/u in HTN clinic in 4 weeks. Future medication options limited due to CKD.   2. Hyperlipidemia - Pt still taking atorvastatin 40mg  daily and had not picked up new rx for 80mg  dose. He is aware to increase dose to 80mg  daily. Has f/u lipids scheduled.  Ovidio Steele E. Eowyn Tabone, PharmD, BCACP, [place] [address], [place], [place] [address] Phone: [phone]; Fax: [phone] 06/30/2018 8:19 AM

## 2018-07-14 ENCOUNTER — Ambulatory Visit: Payer: 59 | Admitting: Cardiology

## 2018-07-27 ENCOUNTER — Ambulatory Visit (INDEPENDENT_AMBULATORY_CARE_PROVIDER_SITE_OTHER): Payer: 59 | Admitting: Cardiology

## 2018-07-27 VITALS — BP 122/78 | HR 78 | Ht 72.0 in | Wt 245.0 lb

## 2018-07-27 DIAGNOSIS — I10 Essential (primary) hypertension: Secondary | ICD-10-CM

## 2018-07-27 DIAGNOSIS — I5022 Chronic systolic (congestive) heart failure: Secondary | ICD-10-CM

## 2018-07-27 DIAGNOSIS — I420 Dilated cardiomyopathy: Secondary | ICD-10-CM | POA: Diagnosis not present

## 2018-07-27 NOTE — Patient Instructions (Signed)
Medication Instructions:  Your physician recommends that you continue on your current medications as directed. Please refer to the Current Medication list given to you today.  If you need a refill on your cardiac medications before your next appointment, please call your pharmacy.   Lab work: Future fasting labs: 12/6   If you have labs (blood work) drawn today and your tests are completely normal, you will receive your results only by: Marland Kitchen MyChart Message (if you have MyChart) OR . A paper copy in the mail If you have any lab test that is abnormal or we need to change your treatment, we will call you to review the results.  Follow-Up: At [facility], you and your health needs are our priority.  As part of our continuing mission to provide you with exceptional heart care, we have created designated Provider Care Teams.  These Care Teams include your primary Cardiologist (physician) and Advanced Practice Providers (APPs -  Physician Assistants and Nurse Practitioners) who all work together to provide you with the care you need, when you need it. You will need a follow up appointment in 6 months.  Please call our office 2 months in advance to schedule this appointment.  You may see Fransico Him, MD or one of the following Advanced Practice Providers on your designated Care Team:   [place], PA-C Melina Copa, PA-C . Ermalinda Barrios, PA-C

## 2018-07-27 NOTE — Progress Notes (Signed)
Cardiology Office Note:    Date:  07/27/2018   ID:  Anthony [name], DOB [birth year], MRN [id number]  PCP:  Patient, No Pcp Per  Cardiologist:  Fransico Him, MD    Referring MD:  and   Chief Complaint  Patient presents with  . Cardiomyopathy  . Congestive Heart Failure  . Hypertension    History of Present Illness:    Anthony [name] is a 62 y.o. male with a hx of chronic systolic CHF in the setting of hypertensive urgency, LVEF 45% 2014 and 35-40% with WMA 03/2018.n  Nuclear stress test showed fixed inf/apical and apical scar vs attenuation with no ischemia EF 37%, intermediate risk b/c of LV dysfunction. Due to  Stage IV-V CKD lack of sx  medical therapy was recommended. Renal has been managing diuretics. Patient also has DM-Hgb A1C 11, HLD with LDL 152 Lipitor increased to 80 mg.  She is here today for followup and is doing well.  She denies any chest pain or pressure, SOB, DOE, PND, orthopnea, LE edema, dizziness, palpitations or syncope. She is compliant with her meds and is tolerating meds with no SE.    Past Medical History:  Diagnosis Date  . CHF (congestive heart failure) ([place])   . Diabetes mellitus without complication ([place])   . Hypertension   . Renal disorder     No past surgical history on file.  Current Medications: Current Meds  Medication Sig  . aspirin 81 MG tablet Take 81 mg by mouth daily.  Marland Kitchen atorvastatin (LIPITOR) 80 MG tablet Take 1 tablet (80 mg total) by mouth daily.  . calcitRIOL (ROCALTROL) 0.5 MCG capsule Take 0.5 mcg by mouth daily.  . febuxostat (ULORIC) 40 MG tablet Take 40 mg by mouth daily.  . fish oil-omega-3 fatty acids 1000 MG capsule Take 1 g by mouth daily.  . hydrALAZINE (APRESOLINE) 100 MG tablet Take 100 mg by mouth 3 (three) times daily.  . isosorbide mononitrate (IMDUR) 60 MG 24 hr tablet Take 60 mg by mouth daily.  Marland Kitchen labetalol (NORMODYNE) 300 MG tablet Take 3 tablets (900 mg total) by mouth 2 (two) times daily.  Marland Kitchen NIFEdipine (PROCARDIA  XL/ADALAT-CC) 90 MG 24 hr tablet Take 1 tablet (90 mg total) by mouth daily.  . Semaglutide,0.25 or 0.5MG /DOS, (OZEMPIC, 0.25 OR 0.5 MG/DOSE,) 2 MG/1.5ML SOPN Inject 0.25 mg into the skin once a week.  . tamsulosin (FLOMAX) 0.4 MG CAPS capsule Take 0.4 mg by mouth daily.   Marland Kitchen torsemide (DEMADEX) 20 MG tablet Take 20 mg by mouth 2 (two) times daily.     Allergies:   Patient has no known allergies.   Social History   Socioeconomic History  . Marital status: Married    Spouse name: Not on file  . Number of children: Not on file  . Years of education: Not on file  . Highest education level: Not on file  Occupational History  . Not on file  Social Needs  . Financial resource strain: Not on file  . Food insecurity:    Worry: Not on file    Inability: Not on file  . Transportation needs:    Medical: Not on file    Non-medical: Not on file  Tobacco Use  . Smoking status: Never Smoker  . Smokeless tobacco: Never Used  Substance and Sexual Activity  . Alcohol use: Yes    Comment: occasional  . Drug use: No  . Sexual activity: Not on file  Lifestyle  . Physical activity:  Days per week: Not on file    Minutes per session: Not on file  . Stress: Not on file  Relationships  . Social connections:    Talks on phone: Not on file    Gets together: Not on file    Attends religious service: Not on file    Active member of club or organization: Not on file    Attends meetings of clubs or organizations: Not on file    Relationship status: Not on file  Other Topics Concern  . Not on file  Social History Narrative  . Not on file     Family History: The patient's family history includes Congestive Heart Failure in his mother; Diabetes Mellitus II in his father; Hypertension in his father.  ROS:   Please see the history of present illness.    ROS  All other systems reviewed and negative.   EKGs/Labs/Other Studies Reviewed:    The following studies were reviewed  today: none  EKG:  EKG is not ordered today.    Recent Labs: 03/27/2018: B Natriuretic Peptide 2,864.7; TSH 0.728 03/29/2018: Magnesium 2.0 04/03/2018: ALT 16; BUN 50; Creatinine, Ser 4.07; Hemoglobin 10.9; Platelets 183; Potassium 3.5; Sodium 139   Recent Lipid Panel    Component Value Date/Time   CHOL 207 (H) 03/30/2018 0544   TRIG 151 (H) 03/30/2018 0544   HDL 25 (L) 03/30/2018 0544   CHOLHDL 8.3 03/30/2018 0544   VLDL 30 03/30/2018 0544   LDLCALC 152 (H) 03/30/2018 0544    Physical Exam:    VS:  BP 122/78   Pulse 78   Ht 6' (1.829 m)   Wt 245 lb (111.1 kg)   BMI 33.23 kg/m     Wt Readings from Last 3 Encounters:  07/27/18 245 lb (111.1 kg)  05/10/18 240 lb (108.9 kg)  04/03/18 251 lb 5.3 oz (114 kg)     GEN:  Well nourished, well developed in no acute distress HEENT: Normal NECK: No JVD; No carotid bruits LYMPHATICS: No lymphadenopathy CARDIAC: RRR, no murmurs, rubs, gallops RESPIRATORY:  Clear to auscultation without rales, wheezing or rhonchi  ABDOMEN: Soft, non-tender, non-distended MUSCULOSKELETAL:  No edema; No deformity  SKIN: Warm and dry NEUROLOGIC:  Alert and oriented x 3 PSYCHIATRIC:  Normal affect   ASSESSMENT:    1. Chronic systolic heart failure ([place])   2. DCM (dilated cardiomyopathy) ([place])   3. Essential hypertension    PLAN:    In order of problems listed above:  1.  Chronic systolic CHF -appears euvolemic on exam today.  His weight is stable.  He will continue on torsemide 20 mg twice daily, Imdur, hydralazine and labetalol.  His diuretics are managed by nephrology.  His last creatinine was 4.07 on 04/03/2018.  2.  DCM -his last echo on 03/28/2018 showed moderately reduced LV systolic function with EF 35 to 40% with moderate diffuse hypokinesis with disproportionate hypokinesis of the anterior lateral and inferior lateral myocardium worrisome for ischemia in the left circumflex territory.   Nuclear stress test 03/2018 showed moderate size and  intensity fixed inferoapical and apical perfusion defect, likely artifact or possibly scar. No reversible ischemia.  Due to chronic kidney disease stage IV and lack of symptoms it was decided not to pursue further ischemic evaluation.  He will continue on Imdur, ASA and statin.  I will check of FLP and ALT to make sure his LDL is at goal at less than 70.  3.  HTN- his blood pressure is well  controlled on exam today.  He will continue on hydralazine 100 mg 3 times daily, Imdur 60 mg daily, labetalol 900 mg twice daily and Procardia 90 mg daily.   Medication Adjustments/Labs and Tests Ordered: Current medicines are reviewed at length with the patient today.  Concerns regarding medicines are outlined above.  No orders of the defined types were placed in this encounter.  No orders of the defined types were placed in this encounter.   Signed, Fransico Him, MD  07/27/2018 1:29 PM    [place]

## 2018-07-28 ENCOUNTER — Other Ambulatory Visit: Payer: 59

## 2018-08-12 ENCOUNTER — Other Ambulatory Visit: Payer: 59

## 2018-08-30 ENCOUNTER — Encounter (HOSPITAL_BASED_OUTPATIENT_CLINIC_OR_DEPARTMENT_OTHER): Payer: Self-pay

## 2018-08-30 ENCOUNTER — Emergency Department (HOSPITAL_BASED_OUTPATIENT_CLINIC_OR_DEPARTMENT_OTHER)
Admission: EM | Admit: 2018-08-30 | Discharge: 2018-08-30 | Disposition: A | Discharge status: Home / Self Care | Payer: 59 | Attending: Emergency Medicine | Admitting: Emergency Medicine

## 2018-08-30 ENCOUNTER — Other Ambulatory Visit: Payer: Self-pay

## 2018-08-30 DIAGNOSIS — J329 Chronic sinusitis, unspecified: Secondary | ICD-10-CM | POA: Diagnosis not present

## 2018-08-30 DIAGNOSIS — N184 Chronic kidney disease, stage 4 (severe): Secondary | ICD-10-CM | POA: Diagnosis not present

## 2018-08-30 DIAGNOSIS — Z7982 Long term (current) use of aspirin: Secondary | ICD-10-CM | POA: Diagnosis not present

## 2018-08-30 DIAGNOSIS — R05 Cough: Secondary | ICD-10-CM | POA: Diagnosis present

## 2018-08-30 DIAGNOSIS — Z79899 Other long term (current) drug therapy: Secondary | ICD-10-CM | POA: Insufficient documentation

## 2018-08-30 DIAGNOSIS — E1122 Type 2 diabetes mellitus with diabetic chronic kidney disease: Secondary | ICD-10-CM | POA: Insufficient documentation

## 2018-08-30 DIAGNOSIS — I130 Hypertensive heart and chronic kidney disease with heart failure and stage 1 through stage 4 chronic kidney disease, or unspecified chronic kidney disease: Secondary | ICD-10-CM | POA: Insufficient documentation

## 2018-08-30 DIAGNOSIS — I5022 Chronic systolic (congestive) heart failure: Secondary | ICD-10-CM | POA: Diagnosis not present

## 2018-08-30 MED ORDER — AMOXICILLIN 500 MG PO CAPS: 500.0000 mg | ORAL_CAPSULE | Freq: Three times a day (TID) | ORAL | 0 refills | Status: AC

## 2018-08-30 NOTE — ED Provider Notes (Signed)
[facility] EMERGENCY DEPARTMENT Provider Note   CSN: [id number] Arrival date & time: 08/30/18  1453     History   Chief Complaint Chief Complaint  Patient presents with  . Cough    HPI Anthony [name] is a 62 y.o. male.  The history is provided by the patient.  Cough  This is a new problem. Episode onset: 5-6 weeks ago. The problem occurs constantly. The problem has not changed since onset.Cough characteristics: Nonproductive for the last 5 weeks and then 2 days ago started coughing up some minimal mucus. There has been no fever. Associated symptoms include chills, rhinorrhea and wheezing. Pertinent negatives include no chest pain, no ear pain, no headaches, no sore throat and no shortness of breath. Associated symptoms comments: For the last 5 to 6 weeks has had significant nasal discharge, when he lays down at night his nose clogs up and he cannot breathe out of his nose.  He has had a cough the whole time which is mostly been nonproductive but 2 days ago started coughing up some mucus.  Also 2 days ago had some chills but that is resolved.  He has never felt short of breath.  He has been using Alka-Seltzer and over-the-counter medications but because his symptoms were not resolving family wanted to make sure he was okay.Marland Kitchen He has tried decongestants for the symptoms. The treatment provided no relief. He is not a smoker. His past medical history does not include COPD or asthma.    Past Medical History:  Diagnosis Date  . CHF (congestive heart failure) ([place])   . Diabetes mellitus without complication ([place])   . Hypertension   . Renal disorder     Patient Active Problem List   Diagnosis Date Noted  . Hypertension 06/02/2018  . Hyperlipidemia 05/05/2018  . Chronic kidney disease   . Acute renal failure with acute tubular necrosis superimposed on stage 4 chronic kidney disease ([place])   . DCM (dilated cardiomyopathy) ([place])   . Chronic systolic heart failure ([place]) 03/27/2018    . DM (diabetes mellitus), type 2 with renal complications ([place]) 81/85/6314  . OSA (obstructive sleep apnea) 03/27/2018  . Elevated troponin 03/27/2018    History reviewed. No pertinent surgical history.      Home Medications    Prior to Admission medications   Medication Sig Start Date End Date Taking? Authorizing Provider  amoxicillin (AMOXIL) 500 MG capsule Take 1 capsule (500 mg total) by mouth 3 (three) times daily. 08/30/18   Blanchie Dessert, MD  aspirin 81 MG tablet Take 81 mg by mouth daily.    [provider]  atorvastatin (LIPITOR) 80 MG tablet Take 1 tablet (80 mg total) by mouth daily. 05/10/18 08/08/18  Imogene Burn, PA-C  calcitRIOL (ROCALTROL) 0.5 MCG capsule Take 0.5 mcg by mouth daily.    [provider]  febuxostat (ULORIC) 40 MG tablet Take 40 mg by mouth daily.    [provider]  fish oil-omega-3 fatty acids 1000 MG capsule Take 1 g by mouth daily.    [provider]  hydrALAZINE (APRESOLINE) 100 MG tablet Take 100 mg by mouth 3 (three) times daily.    [provider]  isosorbide mononitrate (IMDUR) 60 MG 24 hr tablet Take 60 mg by mouth daily.    [provider]  labetalol (NORMODYNE) 300 MG tablet Take 3 tablets (900 mg total) by mouth 2 (two) times daily. 06/02/18   Imogene Burn, PA-C  NIFEdipine (PROCARDIA XL/ADALAT-CC) 90 MG  24 hr tablet Take 1 tablet (90 mg total) by mouth daily. 06/02/18   Imogene Burn, PA-C  Semaglutide,0.25 or 0.5MG /DOS, (OZEMPIC, 0.25 OR 0.5 MG/DOSE,) 2 MG/1.5ML SOPN Inject 0.25 mg into the skin once a week.    [provider]  tamsulosin (FLOMAX) 0.4 MG CAPS capsule Take 0.4 mg by mouth daily.     [provider]  torsemide (DEMADEX) 20 MG tablet Take 20 mg by mouth 2 (two) times daily.    [provider]    Family History Family History  Problem Relation Age of Onset  . Congestive Heart Failure Mother   . Diabetes Mellitus II Father   .  Hypertension Father     Social History Social History   Tobacco Use  . Smoking status: Never Smoker  . Smokeless tobacco: Never Used  Substance Use Topics  . Alcohol use: Yes    Comment: occasional  . Drug use: No     Allergies   Patient has no known allergies.   Review of Systems Review of Systems  Constitutional: Positive for chills.  HENT: Positive for rhinorrhea. Negative for ear pain and sore throat.   Respiratory: Positive for cough and wheezing. Negative for shortness of breath.   Cardiovascular: Negative for chest pain.  Neurological: Negative for headaches.  All other systems reviewed and are negative.    Physical Exam Updated Vital Signs BP (!) 144/85 (BP Location: Left Arm)   Pulse 74   Temp 98.8 F (37.1 C) (Oral)   Resp 18   Ht 6' (1.829 m)   Wt 111.6 kg   SpO2 96%   BMI 33.36 kg/m   Physical Exam Vitals signs and nursing note reviewed.  Constitutional:      General: He is not in acute distress.    Appearance: He is well-developed.  HENT:     Head: Normocephalic and atraumatic.     Right Ear: A middle ear effusion is present.     Left Ear: A middle ear effusion is present.     Nose:     Right Turbinates: Enlarged and swollen.     Left Turbinates: Enlarged and swollen.  Eyes:     Conjunctiva/sclera: Conjunctivae normal.     Pupils: Pupils are equal, round, and reactive to light.  Neck:     Musculoskeletal: Normal range of motion and neck supple.  Cardiovascular:     Rate and Rhythm: Normal rate and regular rhythm.     Heart sounds: No murmur.  Pulmonary:     Effort: Pulmonary effort is normal. No respiratory distress.     Breath sounds: Normal breath sounds. No wheezing or rales.  Abdominal:     General: There is no distension.     Palpations: Abdomen is soft.     Tenderness: There is no abdominal tenderness. There is no guarding or rebound.  Musculoskeletal: Normal range of motion.        General: No tenderness.  Skin:    General:  Skin is warm and dry.     Findings: No erythema or rash.  Neurological:     Mental Status: He is alert and oriented to person, place, and time.  Psychiatric:        Behavior: Behavior normal.      ED Treatments / Results  Labs (all labs ordered are listed, but only abnormal results are displayed) Labs Reviewed - No data to display  EKG None  Radiology No results found.  Procedures Procedures (including critical  care time)  Medications Ordered in ED Medications - No data to display   Initial Impression / Assessment and Plan / ED Course  I have reviewed the triage vital signs and the nursing notes.  Pertinent labs & imaging results that were available during my care of the patient were reviewed by me and considered in my medical decision making (see chart for details).     Pt with symptoms consistent with chronic sinusitis but no sx to suggest bronchitis or PNA.  Well appearing here with normal VS.  No signs of breathing difficulty  No signs of pharyngitis, otitis or abnormal abdominal findings.   Will treat with amoxicillin for chronic sinusitis and have f/u with pCP.  Low suspicion for flu at this time.   Final Clinical Impressions(s) / ED Diagnoses   Final diagnoses:  Chronic sinusitis, unspecified location    ED Discharge Orders         Ordered    amoxicillin (AMOXIL) 500 MG capsule  3 times daily     08/30/18 1743           Blanchie Dessert, MD 08/30/18 [phone]

## 2018-08-30 NOTE — ED Triage Notes (Signed)
C/o flu like sx x 1 month-NAD-steady gait 

## 2019-02-04 ENCOUNTER — Other Ambulatory Visit: Payer: Self-pay

## 2019-02-04 ENCOUNTER — Emergency Department (HOSPITAL_BASED_OUTPATIENT_CLINIC_OR_DEPARTMENT_OTHER)
Admission: EM | Admit: 2019-02-04 | Discharge: 2019-02-04 | Disposition: A | Discharge status: Home / Self Care | Payer: 59 | Attending: Emergency Medicine | Admitting: Emergency Medicine

## 2019-02-04 ENCOUNTER — Emergency Department (HOSPITAL_BASED_OUTPATIENT_CLINIC_OR_DEPARTMENT_OTHER): Payer: 59

## 2019-02-04 ENCOUNTER — Encounter (HOSPITAL_BASED_OUTPATIENT_CLINIC_OR_DEPARTMENT_OTHER): Payer: Self-pay | Admitting: Emergency Medicine

## 2019-02-04 DIAGNOSIS — Z79899 Other long term (current) drug therapy: Secondary | ICD-10-CM | POA: Insufficient documentation

## 2019-02-04 DIAGNOSIS — Z7982 Long term (current) use of aspirin: Secondary | ICD-10-CM | POA: Diagnosis not present

## 2019-02-04 DIAGNOSIS — I5023 Acute on chronic systolic (congestive) heart failure: Secondary | ICD-10-CM

## 2019-02-04 DIAGNOSIS — E1122 Type 2 diabetes mellitus with diabetic chronic kidney disease: Secondary | ICD-10-CM | POA: Insufficient documentation

## 2019-02-04 DIAGNOSIS — I130 Hypertensive heart and chronic kidney disease with heart failure and stage 1 through stage 4 chronic kidney disease, or unspecified chronic kidney disease: Secondary | ICD-10-CM | POA: Insufficient documentation

## 2019-02-04 DIAGNOSIS — N184 Chronic kidney disease, stage 4 (severe): Secondary | ICD-10-CM | POA: Insufficient documentation

## 2019-02-04 DIAGNOSIS — I5020 Unspecified systolic (congestive) heart failure: Secondary | ICD-10-CM | POA: Diagnosis not present

## 2019-02-04 DIAGNOSIS — R0602 Shortness of breath: Secondary | ICD-10-CM | POA: Diagnosis present

## 2019-02-04 LAB — BASIC METABOLIC PANEL
Anion gap: 9 (ref 5–15)
BUN: 52 mg/dL — ABNORMAL HIGH (ref 8–23)
CO2: 18 mmol/L — ABNORMAL LOW (ref 22–32)
Calcium: 8 mg/dL — ABNORMAL LOW (ref 8.9–10.3)
Chloride: 114 mmol/L — ABNORMAL HIGH (ref 98–111)
Creatinine, Ser: 6.01 mg/dL — ABNORMAL HIGH (ref 0.61–1.24)
GFR calc Af Amer: 10 mL/min — ABNORMAL LOW (ref >60)
GFR calc non Af Amer: 9 mL/min — ABNORMAL LOW (ref >60)
Glucose, Bld: 202 mg/dL — ABNORMAL HIGH (ref 70–99)
Potassium: 3.3 mmol/L — ABNORMAL LOW (ref 3.5–5.1)
Sodium: 141 mmol/L (ref 135–145)

## 2019-02-04 LAB — CBC WITH DIFFERENTIAL/PLATELET
Abs Immature Granulocytes: 0.01 10*3/uL (ref 0.00–0.07)
Basophils Absolute: 0 10*3/uL (ref 0.0–0.1)
Basophils Relative: 0 %
Eosinophils Absolute: 0.1 10*3/uL (ref 0.0–0.5)
Eosinophils Relative: 2 %
HCT: 32 % — ABNORMAL LOW (ref 39.0–52.0)
Hemoglobin: 9.9 g/dL — ABNORMAL LOW (ref 13.0–17.0)
Immature Granulocytes: 0 %
Lymphocytes Relative: 14 %
Lymphs Abs: 0.8 10*3/uL (ref 0.7–4.0)
MCH: 28.5 pg (ref 26.0–34.0)
MCHC: 30.9 g/dL (ref 30.0–36.0)
MCV: 92.2 fL (ref 80.0–100.0)
Monocytes Absolute: 0.5 10*3/uL (ref 0.1–1.0)
Monocytes Relative: 9 %
Neutro Abs: 4.5 10*3/uL (ref 1.7–7.7)
Neutrophils Relative %: 75 %
Platelets: 170 10*3/uL (ref 150–400)
RBC: 3.47 MIL/uL — ABNORMAL LOW (ref 4.22–5.81)
RDW: 15.4 % (ref 11.5–15.5)
WBC: 5.9 10*3/uL (ref 4.0–10.5)
nRBC: 0 % (ref 0.0–0.2)

## 2019-02-04 LAB — BRAIN NATRIURETIC PEPTIDE: B Natriuretic Peptide: 3335.5 pg/mL — ABNORMAL HIGH (ref 0.0–100.0)

## 2019-02-04 LAB — TROPONIN I: Troponin I: 0.15 ng/mL (ref <0.03)

## 2019-02-04 MED ORDER — FUROSEMIDE 10 MG/ML IJ SOLN
60.0000 mg | Freq: Once | INTRAMUSCULAR | Status: AC
  Administered 2019-02-04: 15:00:00 60 mg via INTRAVENOUS
  Filled 2019-02-04: qty 6

## 2019-02-04 MED ORDER — METOLAZONE 2.5 MG PO TABS: 2.5000 mg | ORAL_TABLET | Freq: Once | ORAL | 0 refills | Status: DC

## 2019-02-04 NOTE — ED Notes (Signed)
Patient ambulated around department. NO SOB noted, SAT 98%.

## 2019-02-04 NOTE — ED Notes (Signed)
Pt denies fever, denies chest pain

## 2019-02-04 NOTE — ED Notes (Signed)
EKG will be attempted once xray is finished in room.

## 2019-02-04 NOTE — Discharge Instructions (Signed)
Take the metolazone tablet 30 minutes before your morning dose of torsemide tomorrow morning. Limit your salt intake in your diet. First thing in the morning on Monday, it is important that you call your nephrologist (kidney doctor) and cardiologist to follow-up on your visit today. If you develop worsening shortness of breath, chest pain, or new or worsening symptoms, report to the [facility] or [facility] emergency department.

## 2019-02-04 NOTE — ED Triage Notes (Addendum)
Pt c/o shortness of breath and wheezing x 10 days. Pt denies fever, denies cough

## 2019-02-04 NOTE — ED Provider Notes (Signed)
[facility] EMERGENCY DEPARTMENT Provider Note   CSN: [id number] Arrival date & time: 02/04/19  1242    History   Chief Complaint Chief Complaint  Patient presents with  . Shortness of Breath    HPI Anthony [name] is a 63 y.o. male with past medical history of CHF, diabetes mellitus, hypertension, CKD, dilated cardiomyopathy, presenting to the emergency department with complaint of worsening shortness of breath on exertion for over 1 week.  He states this feels similar to prior episode of CHF exacerbation.  He states just taking the trash out he feels severely short of breath.  His shortness of breath is also worse when laying flat.  He endorses increased lower extremity swelling and 2-3 pound weight gain within a days time.  He denies cough, fever, congestion, sore throat, abdominal symptoms.  No known contacts with COVID positive people. Per chart review, last echocardiogram was in July 2019 with EF of 35 to 40% with moderately reduced systolic function and moderately to severely dilated left atrium.  He also had a myocardial perfusion scan during the same month with moderate inferior apical and apical perfusion defect, no reversible ischemia.    The history is provided by the patient and medical records.    Past Medical History:  Diagnosis Date  . CHF (congestive heart failure) ([place])   . Diabetes mellitus without complication ([place])   . Hypertension   . Renal disorder     Patient Active Problem List   Diagnosis Date Noted  . Hypertension 06/02/2018  . Hyperlipidemia 05/05/2018  . Chronic kidney disease   . Acute renal failure with acute tubular necrosis superimposed on stage 4 chronic kidney disease ([place])   . DCM (dilated cardiomyopathy) ([place])   . Chronic systolic heart failure ([place]) 03/27/2018  . DM (diabetes mellitus), type 2 with renal complications ([place]) 14/97/0263  . OSA (obstructive sleep apnea) 03/27/2018  . Elevated troponin 03/27/2018    History  reviewed. No pertinent surgical history.      Home Medications    Prior to Admission medications   Medication Sig Start Date End Date Taking? Authorizing Provider  amoxicillin (AMOXIL) 500 MG capsule Take 1 capsule (500 mg total) by mouth 3 (three) times daily. 08/30/18   Blanchie Dessert, MD  aspirin 81 MG tablet Take 81 mg by mouth daily.    [provider]  atorvastatin (LIPITOR) 80 MG tablet Take 1 tablet (80 mg total) by mouth daily. 05/10/18 08/08/18  Imogene Burn, PA-C  calcitRIOL (ROCALTROL) 0.5 MCG capsule Take 0.5 mcg by mouth daily.    [provider]  febuxostat (ULORIC) 40 MG tablet Take 40 mg by mouth daily.    [provider]  fish oil-omega-3 fatty acids 1000 MG capsule Take 1 g by mouth daily.    [provider]  hydrALAZINE (APRESOLINE) 100 MG tablet Take 100 mg by mouth 3 (three) times daily.    [provider]  isosorbide mononitrate (IMDUR) 60 MG 24 hr tablet Take 60 mg by mouth daily.    [provider]  labetalol (NORMODYNE) 300 MG tablet Take 3 tablets (900 mg total) by mouth 2 (two) times daily. 06/02/18   Imogene Burn, PA-C  metolazone (ZAROXOLYN) 2.5 MG tablet Take 1 tablet (2.5 mg total) by mouth once for 1 dose. Take 1 tablet 30 minutes before your morning dose of torsemide. 02/05/19 02/05/19  Kaston Faughn, [place] N, PA-C  NIFEdipine (PROCARDIA XL/ADALAT-CC) 90 MG 24 hr tablet Take 1 tablet (90 mg  total) by mouth daily. 06/02/18   Imogene Burn, PA-C  Semaglutide,0.25 or 0.5MG /DOS, (OZEMPIC, 0.25 OR 0.5 MG/DOSE,) 2 MG/1.5ML SOPN Inject 0.25 mg into the skin once a week.    [provider]  tamsulosin (FLOMAX) 0.4 MG CAPS capsule Take 0.4 mg by mouth daily.     [provider]  torsemide (DEMADEX) 20 MG tablet Take 20 mg by mouth 2 (two) times daily.    [provider]    Family History Family History  Problem Relation Age of Onset  . Congestive Heart Failure Mother   . Diabetes  Mellitus II Father   . Hypertension Father     Social History Social History   Tobacco Use  . Smoking status: Never Smoker  . Smokeless tobacco: Never Used  Substance Use Topics  . Alcohol use: Yes    Comment: occasional  . Drug use: No     Allergies   Patient has no known allergies.   Review of Systems Review of Systems  Constitutional: Positive for unexpected weight change. Negative for fever.  Respiratory: Positive for shortness of breath and wheezing. Negative for cough.   Cardiovascular: Positive for leg swelling. Negative for chest pain.  Gastrointestinal: Negative for abdominal pain.  All other systems reviewed and are negative.    Physical Exam Updated Vital Signs BP (!) 143/88   Pulse 70   Temp 98.4 F (36.9 C) (Oral)   Resp 15   Ht 6' (1.829 m)   Wt 112.9 kg   SpO2 96%   BMI 33.77 kg/m   Physical Exam Vitals signs and nursing note reviewed.  Constitutional:      General: He is not in acute distress.    Appearance: He is well-developed. He is obese.  HENT:     Head: Normocephalic and atraumatic.  Eyes:     Conjunctiva/sclera: Conjunctivae normal.  Cardiovascular:     Rate and Rhythm: Normal rate and regular rhythm.  Pulmonary:     Effort: Pulmonary effort is normal.     Comments: Diminished b/l Abdominal:     General: Bowel sounds are normal.     Palpations: Abdomen is soft.     Tenderness: There is no abdominal tenderness. There is no guarding.  Musculoskeletal:     Comments: 2+ pitting pretibial edema B/l  Skin:    General: Skin is warm.  Neurological:     Mental Status: He is alert.  Psychiatric:        Behavior: Behavior normal.      ED Treatments / Results  Labs (all labs ordered are listed, but only abnormal results are displayed) Labs Reviewed  BASIC METABOLIC PANEL - Abnormal; Notable for the following components:      Result Value   Potassium 3.3 (*)    Chloride 114 (*)    CO2 18 (*)    Glucose, Bld 202 (*)    BUN  52 (*)    Creatinine, Ser 6.01 (*)    Calcium 8.0 (*)    GFR calc non Af Amer 9 (*)    GFR calc Af Amer 10 (*)    All other components within normal limits  CBC WITH DIFFERENTIAL/PLATELET - Abnormal; Notable for the following components:   RBC 3.47 (*)    Hemoglobin 9.9 (*)    HCT 32.0 (*)    All other components within normal limits  BRAIN NATRIURETIC PEPTIDE - Abnormal; Notable for the following components:   B Natriuretic Peptide 3,335.5 (*)  All other components within normal limits  TROPONIN I - Abnormal; Notable for the following components:   Troponin I 0.15 (*)    All other components within normal limits    EKG EKG Interpretation  Date/Time:  Saturday Feb 04 2019 13:49:07 EDT Ventricular Rate:  67 PR Interval:    QRS Duration: 104 QT Interval:  471 QTC Calculation: 498 R Axis:   48 Text Interpretation:  Sinus rhythm Borderline low voltage, extremity leads Nonspecific T abnrm, anterolateral leads Borderline prolonged QT interval No significant change since last tracing Confirmed by Blanchie Dessert ([id number]) on 02/04/2019 2:06:34 PM   Radiology Dg Chest Port 1 View  Result Date: 02/04/2019 CLINICAL DATA:  Wheezing for 10 days.  No fever. EXAM: PORTABLE CHEST 1 VIEW COMPARISON:  November 04, 2018 FINDINGS: Stable mild cardiomegaly. Hila and mediastinum are normal. No pneumothorax. No nodules or masses. No focal infiltrates. IMPRESSION: No active disease. Electronically Signed   By: Dorise Bullion III M.D   On: 02/04/2019 14:15    Procedures Procedures (including critical care time)  Medications Ordered in ED Medications  furosemide (LASIX) injection 60 mg (60 mg Intravenous Given 02/04/19 1438)     Initial Impression / Assessment and Plan / ED Course  I have reviewed the triage vital signs and the nursing notes.  Pertinent labs & imaging results that were available during my care of the patient were reviewed by me and considered in my medical decision making  (see chart for details).   Patient presenting with symptoms consistent with CHF exacerbation with shortness of breath on exertion and orthopnea for about 10 days.  After further discussion, it appears patient was eating a breakfast bowl for about 2 weeks daily before finding out there were "800mg  of sodium" in each meal.  He does have chronic kidney disease with a last outpatient creatinine in care everywhere of 5.96.  He is on torsemide 20 mg twice daily, and has been taking as prescribed.  No infectious sx. On exam, lungs are clear, normal work of breathing.  O2 saturation 97% on room air.  He does have lower extremity pretibial edema, however is in no apparent distress and speaking in full sentences.  Labs today reveal a elevated troponin, however this appears chronic and unchanged from baseline.  No associated chest pain or new ischemic changes on EKG.  BNP is elevated.  Chest x-ray is negative.  Patient treated with 60 mg of IV Lasix and monitored.  Patient had urine output of about 200 cc after 2-hour monitoring after dose of IV Lasix.  Consult placed to cardiology for recommendations for outpatient therapy, as patient states he feels well enough for discharge and feels as though he can manage this at home.  Clinical Course as of Feb 04 1719  Sat Feb 04, 2019  1643 Patient discussed with Dr. Harrington Challenger with cardiology.  She recommends if patient is comfortable then she is agreeable to discharge.  She recommends 2.5 mg dose of metolazone tomorrow morning 30 minutes before his torsemide dose.  She recommends very close follow-up with his nephrologist on Monday morning for further management of his diuretics and kidney function.  Appreciate consult.   [JR]  1245 Patient ambulated in the ED with a normal O2 saturation and no shortness of breath.  He feels comfortable with discharge and would prefer it.  Discussed cardiology recommendations and instructions for close follow-up with nephrologist and cardiologist  on Monday.  Will prescribe the 2.5 g of metolazone to take  30 minutes prior to torsemide tomorrow morning.  Patient verbalized understanding.  Strict return precautions discussed.  Safe for discharge at this time.   [JR]    Clinical Course User Index [JR] Tim Corriher, [place] N, PA-C   Discussed results, findings, treatment and follow up. Patient advised of return precautions. Patient verbalized understanding and agreed with plan.   Final Clinical Impressions(s) / ED Diagnoses   Final diagnoses:  Acute on chronic systolic congestive heart failure [facility])    ED Discharge Orders         Ordered    metolazone (ZAROXOLYN) 2.5 MG tablet   Once,   Status:  Discontinued     02/04/19 1713    metolazone (ZAROXOLYN) 2.5 MG tablet   Once     02/04/19 1715           Madesyn Ast, [place] N, PA-C 02/04/19 Janan Ridge, MD 02/06/19 2156

## 2019-02-04 NOTE — ED Notes (Signed)
Date and time results received: 02/04/19 1419   Test: trp Critical Value: 0.15  Name of Provider Notified: [place] Orders Received? Or Actions Taken?: no orders given

## 2019-02-06 ENCOUNTER — Telehealth: Payer: Self-pay | Admitting: Nurse Practitioner

## 2019-02-06 NOTE — Telephone Encounter (Signed)
From: Ledora Bottcher, [place]  Sent: 02/04/2019  5:03 PM EDT  To: Tami Lin Duke, PA, [facility] Triage   Per Dr. Harrington Challenger, patient needs an appt next week with Dr. Radford Pax or an APP on her team. Virtual visit is OK  Per Suezanne Jacquet, RN, Dr. Radford Pax does not have any openings this week. Attempted to call patient to schedule virtual visit with Ermalinda Barrios, PA this week. The first time I called it sounded like someone picked up and hung up. The second time the phone rings but no answer and no voice mail. There are no other contact numbers for the patient. Will try to call back later.

## 2019-02-06 NOTE — Telephone Encounter (Signed)
Attempted to call patient. Phone was answered and then hung up.

## 2019-02-07 NOTE — Telephone Encounter (Signed)
VIDEO - DOXIMITY use this #: [phone]. Pt verbally consented to virtual visit.  Does not have BP cuff or way to check HR.     Virtual Visit Pre-Appointment Phone Call  "(Name), I am calling you today to discuss your upcoming appointment. We are currently trying to limit exposure to the virus that causes COVID-19 by seeing patients at home rather than in the office."  1. "What is the BEST phone number to call the day of the visit?" - include this in appointment notes  2. "Do you have or have access to (through a family member/friend) a smartphone with video capability that we can use for your visit?" a. If yes - list this number in appt notes as "cell" (if different from BEST phone #) and list the appointment type as a VIDEO visit in appointment notes b. If no - list the appointment type as a PHONE visit in appointment notes  3. Confirm consent - "In the setting of the current Covid19 crisis, you are scheduled for a (phone or video) visit with your provider on (date) at (time).  Just as we do with many in-office visits, in order for you to participate in this visit, we must obtain consent.  If you'd like, I can send this to your mychart (if signed up) or email for you to review.  Otherwise, I can obtain your verbal consent now.  All virtual visits are billed to your insurance company just like a normal visit would be.  By agreeing to a virtual visit, we'd like you to understand that the technology does not allow for your provider to perform an examination, and thus may limit your provider's ability to fully assess your condition. If your provider identifies any concerns that need to be evaluated in person, we will make arrangements to do so.  Finally, though the technology is pretty good, we cannot assure that it will always work on either your or our end, and in the setting of a video visit, we may have to convert it to a phone-only visit.  In either situation, we cannot ensure that we have a  secure connection.  Are you willing to proceed?" STAFF: Did the patient verbally acknowledge consent to telehealth visit? Document YES/NO here: YES  4. Advise patient to be prepared - "Two hours prior to your appointment, go ahead and check your blood pressure, pulse, oxygen saturation, and your weight (if you have the equipment to check those) and write them all down. When your visit starts, your provider will ask you for this information. If you have an Apple Watch or Kardia device, please plan to have heart rate information ready on the day of your appointment. Please have a pen and paper handy nearby the day of the visit as well."  5. Give patient instructions for MyChart download to smartphone OR Doximity/Doxy.me as below if video visit (depending on what platform provider is using)  6. Inform patient they will receive a phone call 15 minutes prior to their appointment time (may be from unknown caller ID) so they should be prepared to answer    TELEPHONE CALL NOTE  Anthony [name] has been deemed a candidate for a follow-up tele-health visit to limit community exposure during the Covid-19 pandemic. I spoke with the patient via phone to ensure availability of phone/video source, confirm preferred email & phone number, and discuss instructions and expectations.  I reminded Anthony [name] to be prepared with any vital sign and/or heart rhythm information that could  potentially be obtained via home monitoring, at the time of his visit. I reminded Anthony [name] to expect a phone call prior to his visit.  Anthony Key, RN 02/07/2019 5:52 PM   INSTRUCTIONS FOR DOWNLOADING THE MYCHART APP TO SMARTPHONE  - The patient must first make sure to have activated MyChart and know their login information - If Apple, go to [employer] and type in MyChart in the search bar and download the app. If Android, ask patient to go to [employer] and type in [place] in the search bar and download the app. The app  is free but as with any other app downloads, their phone may require them to verify saved payment information or Apple/Android password.  - The patient will need to then log into the app with their MyChart username and password, and select [facility] as their healthcare provider to link the account. When it is time for your visit, go to the MyChart app, find appointments, and click Begin Video Visit. Be sure to Select Allow for your device to access the Microphone and Camera for your visit. You will then be connected, and your provider will be with you shortly.  **If they have any issues connecting, or need assistance please contact MyChart service desk [phone])**  **If using a computer, in order to ensure the best quality for their visit they will need to use either of the following Internet Browsers: [employer], or Google Chrome**  IF USING DOXIMITY or DOXY.ME - The patient will receive a link just prior to their visit by text.     FULL LENGTH CONSENT FOR TELE-HEALTH VISIT   I hereby voluntarily request, consent and authorize [facility] and its employed or contracted physicians, physician assistants, nurse practitioners or other licensed health care professionals (the Practitioner), to provide me with telemedicine health care services (the "Services") as deemed necessary by the treating Practitioner. I acknowledge and consent to receive the Services by the Practitioner via telemedicine. I understand that the telemedicine visit will involve communicating with the Practitioner through live audiovisual communication technology and the disclosure of certain medical information by electronic transmission. I acknowledge that I have been given the opportunity to request an in-person assessment or other available alternative prior to the telemedicine visit and am voluntarily participating in the telemedicine visit.  I understand that I have the right to withhold or withdraw my  consent to the use of telemedicine in the course of my care at any time, without affecting my right to future care or treatment, and that the Practitioner or I may terminate the telemedicine visit at any time. I understand that I have the right to inspect all information obtained and/or recorded in the course of the telemedicine visit and may receive copies of available information for a reasonable fee.  I understand that some of the potential risks of receiving the Services via telemedicine include:  Marland Kitchen Delay or interruption in medical evaluation due to technological equipment failure or disruption; . Information transmitted may not be sufficient (e.g. poor resolution of images) to allow for appropriate medical decision making by the Practitioner; and/or  . In rare instances, security protocols could fail, causing a breach of personal health information.  Furthermore, I acknowledge that it is my responsibility to provide information about my medical history, conditions and care that is complete and accurate to the best of my ability. I acknowledge that Practitioner's advice, recommendations, and/or decision may be based on factors not within their control,  such as incomplete or inaccurate data provided by me or distortions of diagnostic images or specimens that may result from electronic transmissions. I understand that the practice of medicine is not an exact science and that Practitioner makes no warranties or guarantees regarding treatment outcomes. I acknowledge that I will receive a copy of this consent concurrently upon execution via email to the email address I last provided but may also request a printed copy by calling the office of [facility].    I understand that my insurance will be billed for this visit.   I have read or had this consent read to me. . I understand the contents of this consent, which adequately explains the benefits and risks of the Services being provided via telemedicine.   . I have been provided ample opportunity to ask questions regarding this consent and the Services and have had my questions answered to my satisfaction. . I give my informed consent for the services to be provided through the use of telemedicine in my medical care  By participating in this telemedicine visit I agree to the above.

## 2019-02-14 ENCOUNTER — Encounter: Payer: 59 | Admitting: Cardiology

## 2019-02-14 ENCOUNTER — Encounter: Payer: Self-pay | Admitting: Cardiology

## 2019-02-14 ENCOUNTER — Other Ambulatory Visit: Payer: Self-pay

## 2019-02-14 ENCOUNTER — Telehealth (INDEPENDENT_AMBULATORY_CARE_PROVIDER_SITE_OTHER): Payer: 59 | Admitting: Cardiology

## 2019-02-14 VITALS — BP 120/80 | Ht 72.0 in | Wt 244.0 lb

## 2019-02-14 DIAGNOSIS — I5022 Chronic systolic (congestive) heart failure: Secondary | ICD-10-CM | POA: Diagnosis not present

## 2019-02-14 DIAGNOSIS — R0609 Other forms of dyspnea: Secondary | ICD-10-CM | POA: Diagnosis not present

## 2019-02-14 DIAGNOSIS — N185 Chronic kidney disease, stage 5: Secondary | ICD-10-CM

## 2019-02-14 DIAGNOSIS — I132 Hypertensive heart and chronic kidney disease with heart failure and with stage 5 chronic kidney disease, or end stage renal disease: Secondary | ICD-10-CM | POA: Diagnosis not present

## 2019-02-14 MED ORDER — ISOSORBIDE MONONITRATE ER 60 MG PO TB24: 90.0000 mg | ORAL_TABLET | Freq: Every day | ORAL | 3 refills | Status: AC

## 2019-02-14 NOTE — Progress Notes (Signed)
Virtual Visit via Telephone Note   This visit type was conducted due to national recommendations for restrictions regarding the COVID-19 Pandemic (e.g. social distancing) in an effort to limit this patient's exposure and mitigate transmission in our community.  Due to his co-morbid illnesses, this patient is at least at moderate risk for complications without adequate follow up.  This format is felt to be most appropriate for this patient at this time.  The patient did not have access to video technology/had technical difficulties with video requiring transitioning to audio format only (telephone).  All issues noted in this document were discussed and addressed.  No physical exam could be performed with this format.  Please refer to the patient's chart for his  consent to telehealth for [facility].   Date:  02/14/2019   ID:  Anthony [name], DOB [birth year], MRN [id number]  Patient Location: Home Provider Location: Office  PCP:  Patient, No Pcp Per  Cardiologist:  Fransico Him, MD  Electrophysiologist:  None   Evaluation Performed:  Follow-Up Visit  Chief Complaint:  Shortness of breath History of Present Illness:    Anthony [name] is a 63 y.o. male with with a hx of chronic systolic CHF in the setting of hypertensive urgency, LVEF 45% 2014 and 35-40% with WMA 03/2018.n  Nuclear stress test showed fixed inf/apical and apical scar vs attenuation with no ischemia EF 37%, intermediate risk b/c of LV dysfunction. Due to  Stage IV-V CKD lack of sx  medical therapy was recommended. Renal has been managing diuretics. Patient also has DM-Hgb A1C 11, HLD with LDL 152 Lipitor increased to 80 mg.  He was in ER 02/04/19 with HF and Cr was 6.01 normally runs 4.07 He was given IV lasix in ER BNP was 3335, troponin 0.15  hgb was slightly lower at 9.9 from 10.9 10 months ago.  K+ was 3.3  Followed by nephrology in [facility]  He has chronically elevated troponin.   Today he tells me the IV lasix helped but now  DOE. No swelling and no chest pain.  But it has become bothersome.  He sees nephrologist next week.  Per Dr. Theodosia Blender note they have been adjusting diuretic.  BP today is good.    He wears a mask when around others.  Is careful about going out.     The patient does not have symptoms concerning for COVID-19 infection (fever, chills, cough, or new shortness of breath).    Past Medical History:  Diagnosis Date  . CHF (congestive heart failure) ([place])   . Diabetes mellitus without complication ([place])   . Hypertension   . Renal disorder    No past surgical history on file.   Current Meds  Medication Sig  . amoxicillin (AMOXIL) 500 MG capsule Take 1 capsule (500 mg total) by mouth 3 (three) times daily.  Marland Kitchen aspirin 81 MG tablet Take 81 mg by mouth daily.  Marland Kitchen atorvastatin (LIPITOR) 80 MG tablet Take 1 tablet (80 mg total) by mouth daily.  . calcitRIOL (ROCALTROL) 0.5 MCG capsule Take 0.5 mcg by mouth daily.  . febuxostat (ULORIC) 40 MG tablet Take 40 mg by mouth daily.  . fish oil-omega-3 fatty acids 1000 MG capsule Take 1 g by mouth daily.  . hydrALAZINE (APRESOLINE) 100 MG tablet Take 100 mg by mouth 3 (three) times daily.  Marland Kitchen labetalol (NORMODYNE) 300 MG tablet Take 3 tablets (900 mg total) by mouth 2 (two) times daily.  Marland Kitchen NIFEdipine (PROCARDIA XL/ADALAT-CC) 90 MG 24  hr tablet Take 1 tablet (90 mg total) by mouth daily.  . Semaglutide,0.25 or 0.5MG /DOS, (OZEMPIC, 0.25 OR 0.5 MG/DOSE,) 2 MG/1.5ML SOPN Inject 0.25 mg into the skin once a week.  . tamsulosin (FLOMAX) 0.4 MG CAPS capsule Take 0.4 mg by mouth daily.   Marland Kitchen torsemide (DEMADEX) 20 MG tablet Take 20 mg by mouth 2 (two) times daily.  . [DISCONTINUED] isosorbide mononitrate (IMDUR) 60 MG 24 hr tablet Take 60 mg by mouth daily.     Allergies:   Patient has no known allergies.   Social History   Tobacco Use  . Smoking status: Never Smoker  . Smokeless tobacco: Never Used  Substance Use Topics  . Alcohol use: Yes    Comment:  occasional  . Drug use: No     Family Hx: The patient's family history includes Congestive Heart Failure in his mother; Diabetes Mellitus II in his father; Hypertension in his father.  ROS:   Please see the history of present illness.    General:no colds or fevers,  weight is down 5 lbs from ER  Skin:no rashes or ulcers HEENT:no blurred vision, no congestion CV:see HPI PUL:see HPI GI:no diarrhea constipation or melena, no indigestion GU:no hematuria, no dysuria MS:no joint pain, no claudication Neuro:no syncope, no lightheadedness Endo:+ diabetes, no thyroid disease  All other systems reviewed and are negative.   Prior CV studies:   The following studies were reviewed today:  Nuc study 04/01/18  There was no ST segment deviation noted during stress.  No T wave inversion was noted during stress.  Defect 1: There is a medium defect of moderate severity.  This is an intermediate risk study.  Nuclear stress EF: 37%.   Moderate size and intensity fixed inferoapical and apical perfusion defect, likely artifact or possibly scar. No reversible ischemia (SDS 0). LVEF 37% with moderate LV cavity dilatation and severe global hypokinesis. This is an intermediate risk study.  Echo 03/28/18 Study Conclusions  - Left ventricle: The cavity size was normal. There was mild   concentric hypertrophy. Systolic function was moderately reduced.   The estimated ejection fraction was in the range of 35% to 40%.   Moderate diffuse hypokinesis with distinct regional wall motion   abnormalities. Probable disproportionate hypokinesis of the   anterolateral and inferolateral myocardium; consistent with   ischemia in the distribution of the left circumflex coronary   artery. Doppler parameters are consistent with restrictive   physiology, indicative of decreased left ventricular diastolic   compliance and/or increased left atrial pressure. - Mitral valve: There was mild regurgitation. - Left  atrium: The atrium was moderately to severely dilated.   Labs/Other Tests and Data Reviewed:    EKG:  An ECG dated 02/04/19 was personally reviewed today and demonstrated:  SR with non specific T wave abnromlity ant lat leads but no changes from 03/2018  Recent Labs: 03/27/2018: TSH 0.728 03/29/2018: Magnesium 2.0 04/03/2018: ALT 16 02/04/2019: B Natriuretic Peptide 3,335.5; BUN 52; Creatinine, Ser 6.01; Hemoglobin 9.9; Platelets 170; Potassium 3.3; Sodium 141   Recent Lipid Panel Lab Results  Component Value Date/Time   CHOL 207 (H) 03/30/2018 05:44 AM   TRIG 151 (H) 03/30/2018 05:44 AM   HDL 25 (L) 03/30/2018 05:44 AM   CHOLHDL 8.3 03/30/2018 05:44 AM   LDLCALC 152 (H) 03/30/2018 05:44 AM    Wt Readings from Last 3 Encounters:  02/14/19 244 lb (110.7 kg)  02/04/19 249 lb (112.9 kg)  08/30/18 246 lb (111.6 kg)  Objective:    Vital Signs:  BP 120/80   Ht 6' (1.829 m)   Wt 244 lb (110.7 kg)   BMI 33.09 kg/m    VITAL SIGNS:  reviewed  General male in NAD Neuro A&O X 3 answers questions approp Lungs, no SOB with talking and complete sentences without stopping for SOB Psych: pleasant affect   ASSESSMENT & PLAN:    1. DOE this may be due to HF with his stage 5 kidney disease.  Lasix in ER did help for short while.  He is to see nephrology next week.  He could have angina so will increase imdur to 90 mg daily.   His nephrologist haas been managing diuretics 2.  chronic systolic HF wt is better down 5 lbs from ER.  continue current meds 3. 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  Cath could not be done due to CKD. No ace or arb due to CKD,  4. CKD-5   Cr in July was 4.07 and in Feb 5.91 and most recent 6.01.   To see nephrologist next week. 5. HTN controlled today continue meds.  Will have him seen back in the office in 2 weeks.       COVID-19 Education: The signs and symptoms of COVID-19 were discussed with the patient and how to seek care for testing (follow up  with PCP or arrange E-visit).  The importance of social distancing was discussed today.  Time:   Today, I have spent 10 minutes with the patient with telehealth technology discussing the above problems.     Medication Adjustments/Labs and Tests Ordered: Current medicines are reviewed at length with the patient today.  Concerns regarding medicines are outlined above.   Tests Ordered: No orders of the defined types were placed in this encounter.   Medication Changes: Meds ordered this encounter  Medications  . isosorbide mononitrate (IMDUR) 60 MG 24 hr tablet    Sig: Take 1.5 tablets (90 mg total) by mouth daily.    Dispense:  90 tablet    Refill:  3    Disposition:  Follow up in 2 week(s)  Signed, Cecilie Kicks, NP  02/14/2019 4:29 PM    [facility]

## 2019-02-14 NOTE — Progress Notes (Signed)
Error   This encounter was created in error - please disregard. 

## 2019-02-14 NOTE — Patient Instructions (Addendum)
Medication Instructions:  Your physician has recommended you make the following change in your medication:   1. INCREASE ISOSORBIDE TO 1.5 TABLETS (90 mg) DAILY.   If you need a refill on your cardiac medications before your next appointment, please call your pharmacy.   Lab work: NONE If you have labs (blood work) drawn today and your tests are completely normal, you will receive your results only by: Marland Kitchen MyChart Message (if you have MyChart) OR . A paper copy in the mail If you have any lab test that is abnormal or we need to change your treatment, we will call you to review the results.  Testing/Procedures: NONE  Follow-Up: Your physician recommends that you have a follow-up appointment with Ermalinda Barrios, NP on June 23rd at 1:45 p.m   Any Other Special Instructions Will Be Listed Below (If Applicable).

## 2019-02-27 ENCOUNTER — Telehealth: Payer: Self-pay

## 2019-02-27 NOTE — Telephone Encounter (Signed)
Attempted to contact patient again with no answer and VM not set up. Need to ask screening questions for appointment tomorrow. Will try again later.

## 2019-02-27 NOTE — Telephone Encounter (Signed)
Attempted to contact patient multiple times with no answer and VM not set up. Need to ask screening questions for appointment tomorrow. Will try again later.

## 2019-02-28 ENCOUNTER — Ambulatory Visit: Payer: 59 | Admitting: Physician Assistant

## 2019-02-28 NOTE — Telephone Encounter (Signed)
Patient is in the hospital. Appointment cancelled.

## 2019-05-18 ENCOUNTER — Other Ambulatory Visit: Payer: Self-pay | Admitting: Physician Assistant

## 2019-11-12 IMAGING — DX PORTABLE CHEST - 1 VIEW
1 series · 1 of 1 positions shown · non-contrast
Comparison: November 04, 2018

CLINICAL DATA: Wheezing for 10 days.  No fever.

EXAM:
PORTABLE CHEST 1 VIEW

[chest ap]
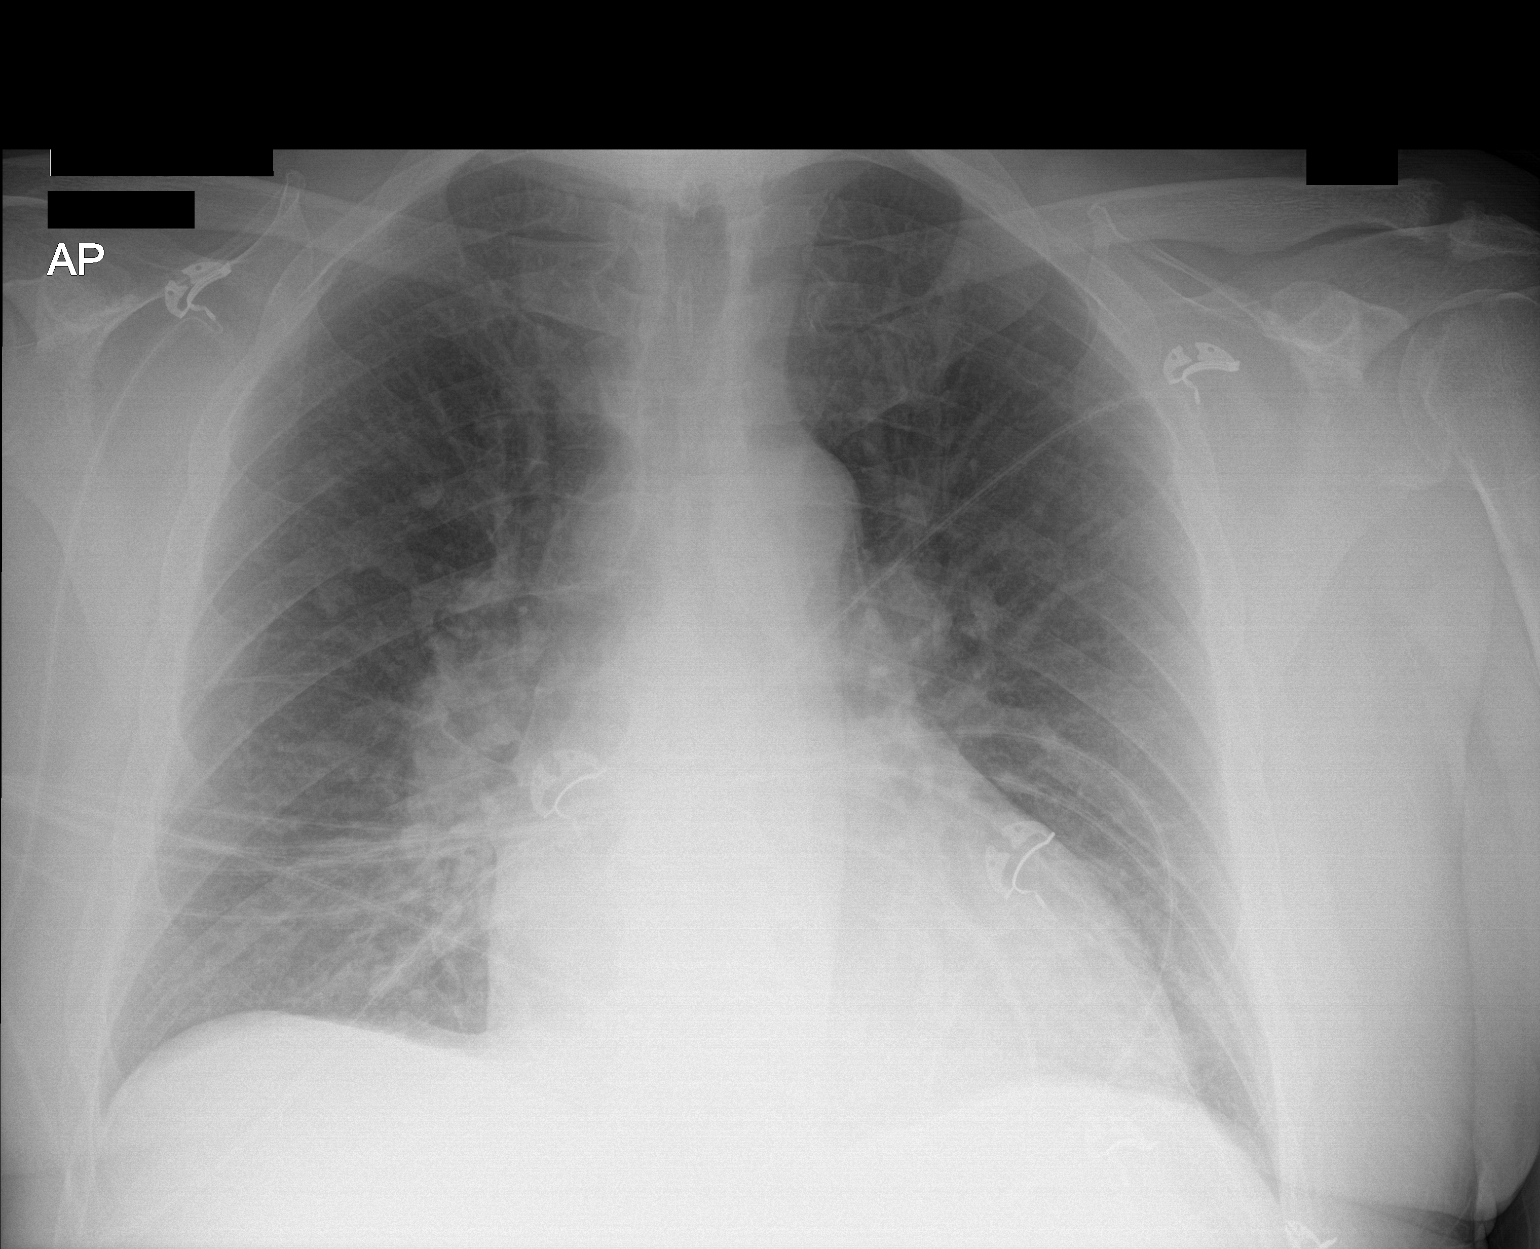

[1 of 1 positions shown; findings below may reference images not displayed]

FINDINGS: Stable mild cardiomegaly. Hila and mediastinum are normal. No
pneumothorax. No nodules or masses. No focal infiltrates.
IMPRESSION: No active disease.

## 2020-02-26 ENCOUNTER — Other Ambulatory Visit: Payer: Self-pay | Admitting: Cardiology

## 2020-03-13 ENCOUNTER — Other Ambulatory Visit: Payer: Self-pay | Admitting: Cardiology

## 2021-02-28 ENCOUNTER — Other Ambulatory Visit: Payer: Self-pay | Admitting: Cardiology

## 2021-03-03 NOTE — Telephone Encounter (Signed)
Rx(s) sent to pharmacy electronically.  

## 2021-08-07 DEATH — deceased
